# Patient Record
Sex: Female | Born: 1950 | Race: White | Hispanic: No | Marital: Married | Smoking: Never smoker
Health system: Southern US, Community
[De-identification: ages and names within clinical notes are randomized; demographics above are authoritative.]

## PROBLEM LIST (undated history)

## (undated) ENCOUNTER — Ambulatory Visit: Admission: EM | Discharge status: Against Medical Advice | Payer: Medicare HMO

## (undated) DIAGNOSIS — H5440 Blindness, one eye, unspecified eye: Secondary | ICD-10-CM

## (undated) DIAGNOSIS — N189 Chronic kidney disease, unspecified: Secondary | ICD-10-CM

## (undated) DIAGNOSIS — I2510 Atherosclerotic heart disease of native coronary artery without angina pectoris: Secondary | ICD-10-CM

## (undated) DIAGNOSIS — I779 Disorder of arteries and arterioles, unspecified: Secondary | ICD-10-CM

## (undated) DIAGNOSIS — Z955 Presence of coronary angioplasty implant and graft: Secondary | ICD-10-CM

## (undated) DIAGNOSIS — I739 Peripheral vascular disease, unspecified: Secondary | ICD-10-CM

## (undated) DIAGNOSIS — F209 Schizophrenia, unspecified: Secondary | ICD-10-CM

## (undated) DIAGNOSIS — E039 Hypothyroidism, unspecified: Secondary | ICD-10-CM

## (undated) DIAGNOSIS — G709 Myoneural disorder, unspecified: Secondary | ICD-10-CM

## (undated) DIAGNOSIS — E0590 Thyrotoxicosis, unspecified without thyrotoxic crisis or storm: Secondary | ICD-10-CM

## (undated) DIAGNOSIS — E785 Hyperlipidemia, unspecified: Secondary | ICD-10-CM

## (undated) DIAGNOSIS — N1831 Chronic kidney disease, stage 3a: Secondary | ICD-10-CM

## (undated) DIAGNOSIS — I10 Essential (primary) hypertension: Secondary | ICD-10-CM

## (undated) DIAGNOSIS — R0602 Shortness of breath: Secondary | ICD-10-CM

## (undated) DIAGNOSIS — I5189 Other ill-defined heart diseases: Secondary | ICD-10-CM

## (undated) DIAGNOSIS — G4730 Sleep apnea, unspecified: Secondary | ICD-10-CM

## (undated) DIAGNOSIS — E119 Type 2 diabetes mellitus without complications: Secondary | ICD-10-CM

## (undated) HISTORY — PX: EYE SURGERY: SHX253

---

## 2005-03-25 ENCOUNTER — Ambulatory Visit: Payer: Self-pay | Admitting: Unknown Physician Specialty

## 2005-04-08 ENCOUNTER — Encounter: Payer: Self-pay | Admitting: Unknown Physician Specialty

## 2005-04-13 ENCOUNTER — Encounter: Payer: Self-pay | Admitting: Unknown Physician Specialty

## 2005-05-14 ENCOUNTER — Encounter: Payer: Self-pay | Admitting: Unknown Physician Specialty

## 2005-07-20 ENCOUNTER — Ambulatory Visit (HOSPITAL_COMMUNITY): Admission: RE | Admit: 2005-07-20 | Discharge: 2005-07-21 | Payer: Self-pay | Admitting: Neurosurgery

## 2005-08-21 ENCOUNTER — Ambulatory Visit: Payer: Self-pay | Admitting: Neurosurgery

## 2005-09-18 ENCOUNTER — Ambulatory Visit: Payer: Self-pay | Admitting: Neurosurgery

## 2005-10-12 ENCOUNTER — Ambulatory Visit: Payer: Self-pay | Admitting: Internal Medicine

## 2006-05-04 ENCOUNTER — Ambulatory Visit: Payer: Self-pay | Admitting: Internal Medicine

## 2006-08-26 ENCOUNTER — Encounter: Admission: RE | Admit: 2006-08-26 | Discharge: 2006-08-26 | Payer: Self-pay | Admitting: Neurosurgery

## 2006-09-06 ENCOUNTER — Ambulatory Visit: Payer: Self-pay | Admitting: Neurosurgery

## 2006-09-30 ENCOUNTER — Ambulatory Visit: Payer: Self-pay | Admitting: General Surgery

## 2006-11-18 ENCOUNTER — Ambulatory Visit: Payer: Self-pay | Admitting: Obstetrics and Gynecology

## 2007-04-08 ENCOUNTER — Ambulatory Visit: Payer: Self-pay | Admitting: Ophthalmology

## 2007-11-29 ENCOUNTER — Ambulatory Visit: Payer: Self-pay | Admitting: Obstetrics and Gynecology

## 2008-12-11 ENCOUNTER — Ambulatory Visit: Payer: Self-pay | Admitting: Obstetrics and Gynecology

## 2009-12-12 ENCOUNTER — Ambulatory Visit: Payer: Self-pay | Admitting: Obstetrics and Gynecology

## 2010-04-01 ENCOUNTER — Ambulatory Visit: Payer: Self-pay | Admitting: Gastroenterology

## 2010-04-23 ENCOUNTER — Ambulatory Visit: Payer: Self-pay | Admitting: Gastroenterology

## 2010-04-23 HISTORY — PX: COLONOSCOPY WITH ESOPHAGOGASTRODUODENOSCOPY (EGD): SHX5779

## 2010-04-23 HISTORY — PX: COLONOSCOPY: SHX174

## 2011-02-03 ENCOUNTER — Ambulatory Visit: Payer: Self-pay | Admitting: Obstetrics and Gynecology

## 2011-08-20 ENCOUNTER — Ambulatory Visit: Payer: Self-pay | Admitting: Surgery

## 2011-09-21 ENCOUNTER — Ambulatory Visit: Payer: Self-pay | Admitting: Surgery

## 2011-09-24 ENCOUNTER — Ambulatory Visit: Payer: Self-pay | Admitting: Surgery

## 2011-10-26 ENCOUNTER — Ambulatory Visit: Payer: Self-pay | Admitting: Internal Medicine

## 2011-11-14 ENCOUNTER — Ambulatory Visit: Payer: Self-pay | Admitting: Internal Medicine

## 2011-12-15 HISTORY — PX: SUPERFICIAL LYMPH NODE BIOPSY / EXCISION: SUR127

## 2012-02-24 ENCOUNTER — Ambulatory Visit: Payer: Self-pay | Admitting: Obstetrics and Gynecology

## 2013-03-21 ENCOUNTER — Ambulatory Visit: Payer: Self-pay | Admitting: Obstetrics and Gynecology

## 2013-04-25 ENCOUNTER — Emergency Department: Payer: Self-pay | Admitting: Emergency Medicine

## 2013-04-25 LAB — COMPREHENSIVE METABOLIC PANEL
Anion Gap: 10 (ref 7–16)
Bilirubin,Total: 1.2 mg/dL — ABNORMAL HIGH (ref 0.2–1.0)
Chloride: 100 mmol/L (ref 98–107)
EGFR (Non-African Amer.): >60
Glucose: 387 mg/dL — ABNORMAL HIGH (ref 65–99)
Potassium: 3.9 mmol/L (ref 3.5–5.1)
SGOT(AST): 24 U/L (ref 15–37)
SGPT (ALT): 29 U/L (ref 12–78)

## 2013-04-25 LAB — URINALYSIS, COMPLETE
Blood: NEGATIVE
Glucose,UR: 100 mg/dL (ref 0–75)
Hyaline Cast: 3
Nitrite: NEGATIVE
Ph: 6 (ref 4.5–8.0)
Protein: NEGATIVE
RBC,UR: 2 /HPF (ref 0–5)
Squamous Epithelial: 1
WBC UR: 1 /HPF (ref 0–5)

## 2013-04-25 LAB — CBC
HGB: 13.3 g/dL (ref 12.0–16.0)
Platelet: 329 10*3/uL (ref 150–440)
RBC: 4.49 10*6/uL (ref 3.80–5.20)
RDW: 13.3 % (ref 11.5–14.5)
WBC: 10.4 10*3/uL (ref 3.6–11.0)

## 2013-04-25 LAB — TROPONIN I: Troponin-I: <0.02 ng/mL

## 2013-04-25 LAB — MAGNESIUM: Magnesium: 1.7 mg/dL — ABNORMAL LOW

## 2013-04-25 LAB — CK TOTAL AND CKMB (NOT AT ARMC): CK-MB: 0.7 ng/mL (ref 0.5–3.6)

## 2013-10-14 DIAGNOSIS — Z955 Presence of coronary angioplasty implant and graft: Secondary | ICD-10-CM

## 2013-10-14 DIAGNOSIS — I2510 Atherosclerotic heart disease of native coronary artery without angina pectoris: Secondary | ICD-10-CM

## 2013-10-14 HISTORY — DX: Atherosclerotic heart disease of native coronary artery without angina pectoris: I25.10

## 2013-10-14 HISTORY — DX: Presence of coronary angioplasty implant and graft: Z95.5

## 2013-10-19 ENCOUNTER — Ambulatory Visit: Payer: Self-pay | Admitting: Internal Medicine

## 2013-10-19 HISTORY — PX: CORONARY ANGIOPLASTY WITH STENT PLACEMENT: SHX49

## 2013-10-19 LAB — CK TOTAL AND CKMB (NOT AT ARMC): CK-MB: 0.8 ng/mL (ref 0.5–3.6)

## 2013-10-20 LAB — BASIC METABOLIC PANEL
Anion Gap: 7 (ref 7–16)
BUN: 14 mg/dL (ref 7–18)
Creatinine: 0.91 mg/dL (ref 0.60–1.30)
EGFR (Non-African Amer.): >60
Osmolality: 276 (ref 275–301)
Sodium: 134 mmol/L — ABNORMAL LOW (ref 136–145)

## 2013-10-20 LAB — CK TOTAL AND CKMB (NOT AT ARMC): CK-MB: <0.5 ng/mL — ABNORMAL LOW (ref 0.5–3.6)

## 2013-10-20 LAB — TROPONIN I: Troponin-I: <0.02 ng/mL

## 2013-11-17 ENCOUNTER — Encounter: Payer: Self-pay | Admitting: Internal Medicine

## 2013-12-14 ENCOUNTER — Encounter: Payer: Self-pay | Admitting: Internal Medicine

## 2014-05-10 ENCOUNTER — Ambulatory Visit: Payer: Self-pay | Admitting: Family Medicine

## 2015-01-11 DIAGNOSIS — I38 Endocarditis, valve unspecified: Secondary | ICD-10-CM | POA: Insufficient documentation

## 2015-01-11 DIAGNOSIS — E782 Mixed hyperlipidemia: Secondary | ICD-10-CM | POA: Insufficient documentation

## 2015-01-11 DIAGNOSIS — I2510 Atherosclerotic heart disease of native coronary artery without angina pectoris: Secondary | ICD-10-CM | POA: Insufficient documentation

## 2015-01-11 DIAGNOSIS — N183 Chronic kidney disease, stage 3 unspecified: Secondary | ICD-10-CM | POA: Insufficient documentation

## 2015-01-11 HISTORY — DX: Endocarditis, valve unspecified: I38

## 2015-04-05 NOTE — Consult Note (Signed)
PATIENT NAME:  Latoya Herman, Latoya Herman MR#:  [id number] DATE OF BIRTH:  1951-10-17  DATE OF ADMISSION:  04/25/2013  REFERRING PHYSICIAN:  Graciella Freer.   CONSULTING PHYSICIAN:  Amiley Shishido S. Gretel Acre, MD  REASON FOR CONSULTATION:  Psychiatric evaluation and medication adjustment.   HISTORY OF PRESENT ILLNESS: The patient is a 64 year old married white female who presented to the ER due to increased heart rate, diaphoresis, and asking for behavior medical consult. The patient was mildly anxious and was disorganized. She was evaluated in the presence of her daughter, as well as her husband. The patient reported that she has been living with her husband, but has been feeling stressed out for the past 3 months. She reported that she worked at [employer], but started being more apprehensive and anxious for the past week. The patient reported that she was sitting in front of her computer in the office but was unable to finish her work and feels like that either she was talking to the computer or the computer was talking to her. She feels that her mind was unable to concentrate. Then she felt that 2 of her coworkers were talking about her. She also felt that one of the guys who came into the office was humming. She felt that the voices were getting stronger. She also felt that she was being taped, and the dog was  wagging the tail against the wall. The patient reported that the coworkers complained about her to human resources, but nobody called her. However, her supervisor called her yesterday and asking her about her state of health, as well as if she needs any changes in her work environment. The patient reported that she was becoming more apprehensive, but she cannot pinpoint the reason for her anxiety and increased paranoia. Her daughter reported that the patient has long history of mental illness, and she was diagnosed with schizophrenia in the past. She was taking medications, but she was unable to clearly identify the  name of the medication. She stated that the side effects included increased weight gain and increased risk of diabetes. When I told them about diabetes, they recognized it immediately. The patient reported that she took the medication for a long time, but then she stopped taking the medication. The patient reported that she is not taking any psychotropic medication at this time. The daughter also verified that the patient has been noncompliant with her medical medications, including for diabetes and for hypertension. Her husband also reported that the patient feels that everybody is against her at home. She feels paranoid at home as well. She does not sleep well and has been becoming increasingly delusional. The patient does not have any suicidal or homicidal ideations or plans.   PAST PSYCHIATRIC HISTORY: The patient has been diagnosed with schizophrenia in the past and has been prescribed psychotropic medications including Zyprexa. She does not have any history of psychiatric hospitalization, as well as suicide attempt.   FAMILY HISTORY: There is no history of psychiatric hospitalization or suicide attempts in the family.   ALCOHOL AND DRUG USE HISTORY: None.   MEDICAL HISTORY: 1.  Hypertension.  2.  Diabetes.  3.  Hypercholesterolemia.   CURRENT MEDICATIONS:  1.  Metformin 500 mg b.i.d.  2.  Metoprolol 50 mg daily.  3.  Aspirin enteric-coated 81 mg daily.  4.  Nexium 1 tablet daily.   ALLERGIES: No known drug allergies. PENICILLIN CAUSES SWELLING.   SOCIAL HISTORY: The patient is currently married and lives with her husband.  She has a daughter who was supportive. She currently works at [employer]. No pending legal charges.   REVIEW OF SYSTEMS: CONSTITUTIONAL: Denies having any fever, fatigue, weakness or pain.  EYES: Denies having any blurred or double vision.  EARS, NOSE, THROAT: No tinnitus. No ear pain or hearing loss noted. RESPIRATORY: Denies any cough, wheezing.  CARDIOVASCULAR:  No orthopnea or chest pain noted.  GASTROINTESTINAL: No nausea, vomiting, diarrhea noted.  GENITOURINARY: No dysuria or hematuria.  ENDOCRINE: No polyuria, polydipsia noted.  INTEGUMENTARY: No acne or rash noted.  NEUROLOGICAL: No numbness, weakness noted.   PHYSICAL EXAMINATION:  VITAL SIGNS:  Temperature 98.8, pulse 102, respirations 18, blood pressure 192/101.    LABORATORY DATA: Glucose 387, BUN 14, creatinine 0.88, sodium 133, potassium 3.9, chloride 100, BUN 23, anion gap 10, osmolality 283, calcium 9.3. Magnesium 1.7. Protein 7.9, albumin 3.6. CK 134, troponin is less than 0.2. TSH 8.91. WBC 10.4, RBC 4.49, hemoglobin 13.3, hematocrit 39.3, platelet count 329.   MENTAL STATUS EXAMINATION: The patient is a moderately-built female who was lying in the bed. She appeared apprehensive. Her speech was low in tone and volume. Mood was depressed and anxious. Affect was congruent. Thought process circumstantial. Thought content was non-delusional. She was unable to describe in detail the events which led to her presentation to the ED. She appears somewhat delusional at this time.   DIAGNOSTIC IMPRESSION: AXIS I:  1.  Schizophrenia, chronic, disorganized type.  2.  Anxiety disorder, not otherwise specified.  AXIS II: None.  AXIS III: Diabetes, hypertension, hypercholesterolemia.   TREATMENT PLAN: 1.  The patient will be released from the ED at this time, and she will be started on Abilify at 5 mg p.o. at bedtime.  2.  Cogentin 1 mg p.o. at bedtime.  3.  Klonopin 0.5 mg p.o. at bedtime.  4.  She will be following up in the outpatient behavioral health [place] [facility] on Monday at 10:30 a.m. Discussed with her family at length about the discharge plan, and they demonstrated understanding.   Thank you for allowing me to participate in the care of this patient.   ____________________________ Cordelia Pen. Gretel Acre, MD usf:dmm D: 04/25/2013 11:35:27 ET T: 04/25/2013  12:00:36 ET JOB#: [id number]  cc: Cordelia Pen. Gretel Acre, MD, <Dictator> Jeronimo Norma MD ELECTRONICALLY SIGNED 04/27/2013 13:38

## 2015-04-05 NOTE — Discharge Summary (Signed)
PATIENT NAME:  Latoya Herman, Latoya Herman MR#:  [id number] DATE OF BIRTH:  [birth year]  DATE OF ADMISSION:  10/19/2013 DATE OF DISCHARGE: 10/20/2013   DISCHARGE DIAGNOSES: 1.  Unstable angina. 2.  Coronary artery disease.  3.  Hypertension.  4.  Hyperlipidemia.  5.  Diabetes.  HISTORY: This is a 64 year old female with diabetes, hypertension, hyperlipidemia with progressive episodes of angina and abnormal stress test in the anterior wall. The patient had a cardiac catheterization showing diffuse coronary artery disease with a significant 90% stenosis of the left anterior descending artery in the mid section. She underwent PCI and stent placement of a drug-eluting stent to the left anterior descending artery without complication. The patient was ambulating well without any further significant symptoms had reached her maximal hospital benefit. She is to be discharged home with followup in 2 weeks.   DISCHARGE MEDICATIONS: Including metoprolol 50 mg each day, lisinopril 10 mg p.o. daily, Lipitor 20 mg p.o. daily, 10 mg p.o. daily, olanzapine 5 mg p.o. daily, glipizide 5 mg p.o. daily, levothyroxine 25 mcg p.o. daily, aspirin 325 mg each day, Plavix 75 mg each day and she is to hold off on metformin for 3 days until reinstatement at a later date for diabetes.   ____________________________ Corey Skains, MD bjk:aw D: 10/20/2013 07:26:54 ET T: 10/20/2013 07:43:19 ET JOB#: [id number]  cc: Corey Skains, MD, <Dictator> Corey Skains MD ELECTRONICALLY SIGNED 10/30/2013 13:16

## 2015-06-26 DIAGNOSIS — I10 Essential (primary) hypertension: Secondary | ICD-10-CM | POA: Insufficient documentation

## 2016-01-28 DIAGNOSIS — I10 Essential (primary) hypertension: Secondary | ICD-10-CM | POA: Diagnosis not present

## 2016-01-28 DIAGNOSIS — M79605 Pain in left leg: Secondary | ICD-10-CM | POA: Diagnosis not present

## 2016-01-28 DIAGNOSIS — E782 Mixed hyperlipidemia: Secondary | ICD-10-CM | POA: Diagnosis not present

## 2016-01-28 DIAGNOSIS — I2510 Atherosclerotic heart disease of native coronary artery without angina pectoris: Secondary | ICD-10-CM | POA: Diagnosis not present

## 2016-09-25 DIAGNOSIS — Z23 Encounter for immunization: Secondary | ICD-10-CM | POA: Diagnosis not present

## 2016-12-23 DIAGNOSIS — E119 Type 2 diabetes mellitus without complications: Secondary | ICD-10-CM | POA: Diagnosis not present

## 2017-05-17 DIAGNOSIS — E782 Mixed hyperlipidemia: Secondary | ICD-10-CM | POA: Diagnosis not present

## 2017-05-17 DIAGNOSIS — I2510 Atherosclerotic heart disease of native coronary artery without angina pectoris: Secondary | ICD-10-CM | POA: Diagnosis not present

## 2017-05-17 DIAGNOSIS — I10 Essential (primary) hypertension: Secondary | ICD-10-CM | POA: Diagnosis not present

## 2017-06-24 DIAGNOSIS — L03312 Cellulitis of back [any part except buttock]: Secondary | ICD-10-CM | POA: Diagnosis not present

## 2017-09-08 DIAGNOSIS — Z0000 Encounter for general adult medical examination without abnormal findings: Secondary | ICD-10-CM | POA: Insufficient documentation

## 2017-09-08 DIAGNOSIS — E119 Type 2 diabetes mellitus without complications: Secondary | ICD-10-CM | POA: Diagnosis not present

## 2017-09-08 DIAGNOSIS — E782 Mixed hyperlipidemia: Secondary | ICD-10-CM | POA: Diagnosis not present

## 2017-09-08 DIAGNOSIS — I10 Essential (primary) hypertension: Secondary | ICD-10-CM | POA: Diagnosis not present

## 2017-09-08 DIAGNOSIS — I2510 Atherosclerotic heart disease of native coronary artery without angina pectoris: Secondary | ICD-10-CM | POA: Diagnosis not present

## 2017-09-08 DIAGNOSIS — Z1231 Encounter for screening mammogram for malignant neoplasm of breast: Secondary | ICD-10-CM | POA: Diagnosis not present

## 2017-09-08 DIAGNOSIS — H9313 Tinnitus, bilateral: Secondary | ICD-10-CM | POA: Diagnosis not present

## 2017-09-08 DIAGNOSIS — Z23 Encounter for immunization: Secondary | ICD-10-CM | POA: Diagnosis not present

## 2017-09-10 DIAGNOSIS — E782 Mixed hyperlipidemia: Secondary | ICD-10-CM | POA: Diagnosis not present

## 2017-09-10 DIAGNOSIS — E119 Type 2 diabetes mellitus without complications: Secondary | ICD-10-CM | POA: Diagnosis not present

## 2017-09-10 DIAGNOSIS — H9313 Tinnitus, bilateral: Secondary | ICD-10-CM | POA: Diagnosis not present

## 2017-09-10 DIAGNOSIS — I2510 Atherosclerotic heart disease of native coronary artery without angina pectoris: Secondary | ICD-10-CM | POA: Diagnosis not present

## 2017-09-13 ENCOUNTER — Other Ambulatory Visit: Payer: Self-pay | Admitting: Internal Medicine

## 2017-09-13 DIAGNOSIS — Z1231 Encounter for screening mammogram for malignant neoplasm of breast: Secondary | ICD-10-CM

## 2017-09-17 DIAGNOSIS — H903 Sensorineural hearing loss, bilateral: Secondary | ICD-10-CM | POA: Diagnosis not present

## 2017-09-17 DIAGNOSIS — H9319 Tinnitus, unspecified ear: Secondary | ICD-10-CM | POA: Diagnosis not present

## 2017-09-28 DIAGNOSIS — I10 Essential (primary) hypertension: Secondary | ICD-10-CM | POA: Diagnosis not present

## 2017-09-28 DIAGNOSIS — M5136 Other intervertebral disc degeneration, lumbar region: Secondary | ICD-10-CM | POA: Diagnosis not present

## 2017-09-28 DIAGNOSIS — E119 Type 2 diabetes mellitus without complications: Secondary | ICD-10-CM | POA: Diagnosis not present

## 2017-09-28 DIAGNOSIS — M25552 Pain in left hip: Secondary | ICD-10-CM | POA: Diagnosis not present

## 2017-09-28 DIAGNOSIS — M5440 Lumbago with sciatica, unspecified side: Secondary | ICD-10-CM | POA: Diagnosis not present

## 2017-10-01 ENCOUNTER — Ambulatory Visit
Admission: RE | Admit: 2017-10-01 | Discharge: 2017-10-01 | Disposition: A | Discharge status: Home / Self Care | Payer: PPO | Source: Ambulatory Visit | Attending: Internal Medicine | Admitting: Internal Medicine

## 2017-10-01 ENCOUNTER — Encounter (HOSPITAL_COMMUNITY): Payer: Self-pay

## 2017-10-01 DIAGNOSIS — Z1231 Encounter for screening mammogram for malignant neoplasm of breast: Secondary | ICD-10-CM | POA: Diagnosis not present

## 2017-10-05 DIAGNOSIS — M5440 Lumbago with sciatica, unspecified side: Secondary | ICD-10-CM | POA: Diagnosis not present

## 2017-10-05 DIAGNOSIS — M6281 Muscle weakness (generalized): Secondary | ICD-10-CM | POA: Diagnosis not present

## 2017-10-12 DIAGNOSIS — M5440 Lumbago with sciatica, unspecified side: Secondary | ICD-10-CM | POA: Diagnosis not present

## 2017-10-12 DIAGNOSIS — M6281 Muscle weakness (generalized): Secondary | ICD-10-CM | POA: Diagnosis not present

## 2017-10-12 DIAGNOSIS — E119 Type 2 diabetes mellitus without complications: Secondary | ICD-10-CM | POA: Diagnosis not present

## 2017-10-12 DIAGNOSIS — M5136 Other intervertebral disc degeneration, lumbar region: Secondary | ICD-10-CM | POA: Diagnosis not present

## 2017-10-12 DIAGNOSIS — M222X2 Patellofemoral disorders, left knee: Secondary | ICD-10-CM | POA: Diagnosis not present

## 2017-10-19 DIAGNOSIS — M5440 Lumbago with sciatica, unspecified side: Secondary | ICD-10-CM | POA: Diagnosis not present

## 2017-10-19 DIAGNOSIS — M6281 Muscle weakness (generalized): Secondary | ICD-10-CM | POA: Diagnosis not present

## 2017-10-21 DIAGNOSIS — M6281 Muscle weakness (generalized): Secondary | ICD-10-CM | POA: Diagnosis not present

## 2017-10-21 DIAGNOSIS — M5440 Lumbago with sciatica, unspecified side: Secondary | ICD-10-CM | POA: Diagnosis not present

## 2017-10-26 DIAGNOSIS — M5440 Lumbago with sciatica, unspecified side: Secondary | ICD-10-CM | POA: Diagnosis not present

## 2017-10-26 DIAGNOSIS — M6281 Muscle weakness (generalized): Secondary | ICD-10-CM | POA: Diagnosis not present

## 2017-11-09 DIAGNOSIS — M6281 Muscle weakness (generalized): Secondary | ICD-10-CM | POA: Diagnosis not present

## 2017-11-09 DIAGNOSIS — M5440 Lumbago with sciatica, unspecified side: Secondary | ICD-10-CM | POA: Diagnosis not present

## 2017-11-18 DIAGNOSIS — M5440 Lumbago with sciatica, unspecified side: Secondary | ICD-10-CM | POA: Diagnosis not present

## 2017-11-25 DIAGNOSIS — M6281 Muscle weakness (generalized): Secondary | ICD-10-CM | POA: Diagnosis not present

## 2017-11-25 DIAGNOSIS — M5440 Lumbago with sciatica, unspecified side: Secondary | ICD-10-CM | POA: Diagnosis not present

## 2018-01-17 ENCOUNTER — Other Ambulatory Visit: Payer: Self-pay | Admitting: Internal Medicine

## 2018-01-17 DIAGNOSIS — E039 Hypothyroidism, unspecified: Secondary | ICD-10-CM | POA: Insufficient documentation

## 2018-01-17 DIAGNOSIS — N644 Mastodynia: Secondary | ICD-10-CM

## 2018-01-25 ENCOUNTER — Other Ambulatory Visit: Payer: PPO

## 2018-01-26 ENCOUNTER — Ambulatory Visit
Admission: RE | Admit: 2018-01-26 | Discharge: 2018-01-26 | Disposition: A | Discharge status: Home / Self Care | Payer: Medicare HMO | Source: Ambulatory Visit | Attending: Internal Medicine | Admitting: Internal Medicine

## 2018-01-26 DIAGNOSIS — N644 Mastodynia: Secondary | ICD-10-CM | POA: Diagnosis present

## 2018-12-15 ENCOUNTER — Other Ambulatory Visit: Payer: Self-pay | Admitting: Internal Medicine

## 2018-12-15 ENCOUNTER — Other Ambulatory Visit (HOSPITAL_COMMUNITY): Payer: Self-pay | Admitting: Internal Medicine

## 2018-12-15 DIAGNOSIS — I2510 Atherosclerotic heart disease of native coronary artery without angina pectoris: Secondary | ICD-10-CM

## 2018-12-15 DIAGNOSIS — N179 Acute kidney failure, unspecified: Secondary | ICD-10-CM

## 2018-12-22 ENCOUNTER — Ambulatory Visit
Admission: RE | Admit: 2018-12-22 | Discharge: 2018-12-22 | Disposition: A | Discharge status: Home / Self Care | Payer: Medicare HMO | Source: Ambulatory Visit | Attending: Internal Medicine | Admitting: Internal Medicine

## 2018-12-22 DIAGNOSIS — I2510 Atherosclerotic heart disease of native coronary artery without angina pectoris: Secondary | ICD-10-CM

## 2018-12-22 DIAGNOSIS — N179 Acute kidney failure, unspecified: Secondary | ICD-10-CM

## 2019-01-20 ENCOUNTER — Other Ambulatory Visit: Payer: Self-pay | Admitting: Internal Medicine

## 2019-01-20 DIAGNOSIS — Z1231 Encounter for screening mammogram for malignant neoplasm of breast: Secondary | ICD-10-CM

## 2019-02-09 ENCOUNTER — Ambulatory Visit
Admission: RE | Admit: 2019-02-09 | Discharge: 2019-02-09 | Disposition: A | Discharge status: Home / Self Care | Payer: Medicare HMO | Source: Ambulatory Visit | Attending: Internal Medicine | Admitting: Internal Medicine

## 2019-02-09 DIAGNOSIS — Z1231 Encounter for screening mammogram for malignant neoplasm of breast: Secondary | ICD-10-CM

## 2019-02-28 ENCOUNTER — Other Ambulatory Visit: Payer: Self-pay | Admitting: Physical Medicine and Rehabilitation

## 2019-02-28 DIAGNOSIS — M5416 Radiculopathy, lumbar region: Secondary | ICD-10-CM

## 2019-03-08 ENCOUNTER — Ambulatory Visit
Admission: RE | Admit: 2019-03-08 | Discharge: 2019-03-08 | Disposition: A | Discharge status: Home / Self Care | Payer: Medicare HMO | Source: Ambulatory Visit | Attending: Physical Medicine and Rehabilitation | Admitting: Physical Medicine and Rehabilitation

## 2019-03-08 ENCOUNTER — Other Ambulatory Visit: Payer: Self-pay

## 2019-03-08 DIAGNOSIS — M5416 Radiculopathy, lumbar region: Secondary | ICD-10-CM | POA: Diagnosis not present

## 2019-10-02 DIAGNOSIS — R0602 Shortness of breath: Secondary | ICD-10-CM | POA: Insufficient documentation

## 2020-06-27 ENCOUNTER — Other Ambulatory Visit: Payer: Self-pay | Admitting: Internal Medicine

## 2020-06-27 DIAGNOSIS — Z1231 Encounter for screening mammogram for malignant neoplasm of breast: Secondary | ICD-10-CM

## 2020-07-18 ENCOUNTER — Other Ambulatory Visit: Payer: Self-pay

## 2020-07-18 ENCOUNTER — Ambulatory Visit
Admission: RE | Admit: 2020-07-18 | Discharge: 2020-07-18 | Disposition: A | Discharge status: Home / Self Care | Payer: Medicare HMO | Source: Ambulatory Visit | Attending: Internal Medicine | Admitting: Internal Medicine

## 2020-07-18 DIAGNOSIS — Z1231 Encounter for screening mammogram for malignant neoplasm of breast: Secondary | ICD-10-CM | POA: Insufficient documentation

## 2020-08-21 ENCOUNTER — Other Ambulatory Visit
Admission: RE | Admit: 2020-08-21 | Discharge: 2020-08-21 | Disposition: A | Discharge status: Home / Self Care | Payer: Medicare HMO | Source: Ambulatory Visit | Attending: Gastroenterology | Admitting: Gastroenterology

## 2020-08-21 ENCOUNTER — Other Ambulatory Visit: Payer: Self-pay

## 2020-08-21 DIAGNOSIS — Z01812 Encounter for preprocedural laboratory examination: Secondary | ICD-10-CM | POA: Diagnosis present

## 2020-08-21 DIAGNOSIS — Z20822 Contact with and (suspected) exposure to covid-19: Secondary | ICD-10-CM | POA: Diagnosis not present

## 2020-08-21 LAB — SARS CORONAVIRUS 2 (TAT 6-24 HRS): SARS Coronavirus 2: NEGATIVE

## 2020-08-22 ENCOUNTER — Encounter: Payer: Self-pay | Admitting: *Deleted

## 2020-08-23 ENCOUNTER — Ambulatory Visit: Payer: Medicare HMO | Admitting: Anesthesiology

## 2020-08-23 ENCOUNTER — Ambulatory Visit
Admission: RE | Admit: 2020-08-23 | Discharge: 2020-08-23 | Disposition: A | Discharge status: Home / Self Care | Payer: Medicare HMO | Attending: Gastroenterology | Admitting: Gastroenterology

## 2020-08-23 ENCOUNTER — Other Ambulatory Visit: Payer: Self-pay

## 2020-08-23 ENCOUNTER — Encounter: Payer: Self-pay | Admitting: *Deleted

## 2020-08-23 ENCOUNTER — Encounter
Admission: RE | Disposition: A | Discharge status: Home / Self Care | Payer: Self-pay | Source: Home / Self Care | Attending: Gastroenterology

## 2020-08-23 DIAGNOSIS — N189 Chronic kidney disease, unspecified: Secondary | ICD-10-CM | POA: Diagnosis not present

## 2020-08-23 DIAGNOSIS — Z888 Allergy status to other drugs, medicaments and biological substances status: Secondary | ICD-10-CM | POA: Insufficient documentation

## 2020-08-23 DIAGNOSIS — Z79899 Other long term (current) drug therapy: Secondary | ICD-10-CM | POA: Insufficient documentation

## 2020-08-23 DIAGNOSIS — I129 Hypertensive chronic kidney disease with stage 1 through stage 4 chronic kidney disease, or unspecified chronic kidney disease: Secondary | ICD-10-CM | POA: Diagnosis not present

## 2020-08-23 DIAGNOSIS — Z955 Presence of coronary angioplasty implant and graft: Secondary | ICD-10-CM | POA: Insufficient documentation

## 2020-08-23 DIAGNOSIS — Z79890 Hormone replacement therapy (postmenopausal): Secondary | ICD-10-CM | POA: Diagnosis not present

## 2020-08-23 DIAGNOSIS — Z1211 Encounter for screening for malignant neoplasm of colon: Secondary | ICD-10-CM | POA: Diagnosis not present

## 2020-08-23 DIAGNOSIS — I2510 Atherosclerotic heart disease of native coronary artery without angina pectoris: Secondary | ICD-10-CM | POA: Insufficient documentation

## 2020-08-23 DIAGNOSIS — G4730 Sleep apnea, unspecified: Secondary | ICD-10-CM | POA: Insufficient documentation

## 2020-08-23 DIAGNOSIS — Z8719 Personal history of other diseases of the digestive system: Secondary | ICD-10-CM | POA: Diagnosis present

## 2020-08-23 DIAGNOSIS — K621 Rectal polyp: Secondary | ICD-10-CM | POA: Diagnosis not present

## 2020-08-23 DIAGNOSIS — D125 Benign neoplasm of sigmoid colon: Secondary | ICD-10-CM | POA: Insufficient documentation

## 2020-08-23 DIAGNOSIS — K640 First degree hemorrhoids: Secondary | ICD-10-CM | POA: Diagnosis not present

## 2020-08-23 DIAGNOSIS — E1122 Type 2 diabetes mellitus with diabetic chronic kidney disease: Secondary | ICD-10-CM | POA: Diagnosis not present

## 2020-08-23 DIAGNOSIS — D123 Benign neoplasm of transverse colon: Secondary | ICD-10-CM | POA: Insufficient documentation

## 2020-08-23 DIAGNOSIS — Z880 Allergy status to penicillin: Secondary | ICD-10-CM | POA: Diagnosis not present

## 2020-08-23 DIAGNOSIS — E785 Hyperlipidemia, unspecified: Secondary | ICD-10-CM | POA: Diagnosis not present

## 2020-08-23 DIAGNOSIS — D122 Benign neoplasm of ascending colon: Secondary | ICD-10-CM | POA: Insufficient documentation

## 2020-08-23 DIAGNOSIS — Z7984 Long term (current) use of oral hypoglycemic drugs: Secondary | ICD-10-CM | POA: Diagnosis not present

## 2020-08-23 DIAGNOSIS — Z9049 Acquired absence of other specified parts of digestive tract: Secondary | ICD-10-CM | POA: Diagnosis not present

## 2020-08-23 HISTORY — DX: Atherosclerotic heart disease of native coronary artery without angina pectoris: I25.10

## 2020-08-23 HISTORY — DX: Chronic kidney disease, unspecified: N18.9

## 2020-08-23 HISTORY — DX: Type 2 diabetes mellitus without complications: E11.9

## 2020-08-23 HISTORY — DX: Hyperlipidemia, unspecified: E78.5

## 2020-08-23 HISTORY — PX: COLONOSCOPY WITH PROPOFOL: SHX5780

## 2020-08-23 HISTORY — DX: Essential (primary) hypertension: I10

## 2020-08-23 HISTORY — DX: Shortness of breath: R06.02

## 2020-08-23 LAB — GLUCOSE, CAPILLARY
Glucose-Capillary: 122 mg/dL — ABNORMAL HIGH (ref 70–99)
Glucose-Capillary: 115 mg/dL — ABNORMAL HIGH (ref 70–99)

## 2020-08-23 SURGERY — COLONOSCOPY WITH PROPOFOL
Anesthesia: General

## 2020-08-23 MED ORDER — PROPOFOL 500 MG/50ML IV EMUL
INTRAVENOUS | Status: AC
  Filled 2020-08-23: qty 50

## 2020-08-23 MED ORDER — PROPOFOL 500 MG/50ML IV EMUL
INTRAVENOUS | Status: DC | PRN
  Administered 2020-08-23: 130 ug/kg/min via INTRAVENOUS

## 2020-08-23 MED ORDER — PROPOFOL 10 MG/ML IV BOLUS
INTRAVENOUS | Status: DC | PRN
  Administered 2020-08-23: 80 mg via INTRAVENOUS

## 2020-08-23 MED ORDER — LIDOCAINE HCL (PF) 2 % IJ SOLN
INTRAMUSCULAR | Status: AC
  Filled 2020-08-23: qty 5

## 2020-08-23 MED ORDER — LIDOCAINE HCL (CARDIAC) PF 100 MG/5ML IV SOSY
PREFILLED_SYRINGE | INTRAVENOUS | Status: DC | PRN
  Administered 2020-08-23: 50 mg via INTRAVENOUS

## 2020-08-23 MED ORDER — SODIUM CHLORIDE 0.9 % IV SOLN: INTRAVENOUS | Status: DC

## 2020-08-23 NOTE — Anesthesia Postprocedure Evaluation (Signed)
Anesthesia Post Note  Patient: Latoya Herman  Procedure(s) Performed: COLONOSCOPY WITH PROPOFOL (N/A )  Patient location during evaluation: Endoscopy Anesthesia Type: General Level of consciousness: awake and alert Pain management: pain level controlled Vital Signs Assessment: post-procedure vital signs reviewed and stable Respiratory status: spontaneous breathing, nonlabored ventilation, respiratory function stable and patient connected to nasal cannula oxygen Cardiovascular status: blood pressure returned to baseline and stable Postop Assessment: no apparent nausea or vomiting Anesthetic complications: no   No complications documented.   Last Vitals:  Vitals:   08/23/20 0914 08/23/20 0934  BP: 119/68 (!) 146/61  Pulse: 70   Resp: (!) 9   Temp: 36.6 C   SpO2: 99%     Last Pain:  Vitals:   08/23/20 0934  TempSrc:   PainSc: 0-No pain                 Latoya Herman

## 2020-08-23 NOTE — Op Note (Signed)
[facility] Gastroenterology Patient Name: Latoya Herman Procedure Date: 08/23/2020 8:36 AM MRN: [id number] Account #: [id number] Date of Birth: 1951-02-09 Admit Type: Outpatient Age: 69 Room: [facility] ENDO ROOM 3 Gender: Female Note Status: Finalized Procedure:             Colonoscopy Indications:           Screening for colorectal malignant neoplasm Providers:             Andrey Farmer MD, MD Referring MD:          Ocie Cornfield. Ouida Sills MD, MD (Referring MD) Medicines:             Monitored Anesthesia Care Complications:         No immediate complications. Estimated blood loss:                         Minimal. Procedure:             Pre-Anesthesia Assessment:                        - Prior to the procedure, a History and Physical was                         performed, and patient medications and allergies were                         reviewed. The patient is competent. The risks and                         benefits of the procedure and the sedation options and                         risks were discussed with the patient. All questions                         were answered and informed consent was obtained.                         Patient identification and proposed procedure were                         verified by the physician, the nurse, the anesthetist                         and the technician in the endoscopy suite. Mental                         Status Examination: alert and oriented. Airway                         Examination: normal oropharyngeal airway and neck                         mobility. Respiratory Examination: clear to                         auscultation. CV Examination: normal. Prophylactic  Antibiotics: The patient does not require prophylactic                         antibiotics. Prior Anticoagulants: The patient has                         taken no previous anticoagulant or antiplatelet                         agents.  ASA Grade Assessment: III - A patient with                         severe systemic disease. After reviewing the risks and                         benefits, the patient was deemed in satisfactory                         condition to undergo the procedure. The anesthesia                         plan was to use monitored anesthesia care (MAC).                         Immediately prior to administration of medications,                         the patient was re-assessed for adequacy to receive                         sedatives. The heart rate, respiratory rate, oxygen                         saturations, blood pressure, adequacy of pulmonary                         ventilation, and response to care were monitored                         throughout the procedure. The physical status of the                         patient was re-assessed after the procedure.                        After obtaining informed consent, the colonoscope was                         passed under direct vision. Throughout the procedure,                         the patient's blood pressure, pulse, and oxygen                         saturations were monitored continuously. The                         Colonoscope was introduced through the anus and  advanced to the the cecum, identified by appendiceal                         orifice and ileocecal valve. The colonoscopy was                         performed without difficulty. The patient tolerated                         the procedure well. The quality of the bowel                         preparation was good. Findings:      The perianal and digital rectal examinations were normal.      A 4 mm polyp was found in the ascending colon. The polyp was sessile.       The polyp was removed with a cold snare. Resection and retrieval were       complete. Estimated blood loss was minimal.      A 2 mm polyp was found in the transverse colon. The polyp was  sessile.       The polyp was removed with a cold snare. Resection and retrieval were       complete. Estimated blood loss was minimal.      A 6 mm polyp was found in the recto-sigmoid colon. The polyp was       semi-pedunculated. The polyp was removed with a cold snare. Resection       and retrieval were complete. Estimated blood loss was minimal. A small       amount of possible residual tissue seen at the periphery of polypectomy       but likely from edema from cold snare but this was biopsied and placed       in seperate jar.      A 2 mm polyp was found in the rectum. The polyp was sessile. The polyp       was removed with a jumbo cold forceps. Resection and retrieval were       complete. Estimated blood loss was minimal.      Internal hemorrhoids were found during retroflexion. The hemorrhoids       were Grade I (internal hemorrhoids that do not prolapse). Impression:            - One 4 mm polyp in the ascending colon, removed with                         a cold snare. Resected and retrieved.                        - One 2 mm polyp in the transverse colon, removed with                         a cold snare. Resected and retrieved.                        - One 6 mm polyp at the recto-sigmoid colon, removed                         with a cold snare. Resected and retrieved.                        -  One 2 mm polyp in the rectum, removed with a jumbo                         cold forceps. Resected and retrieved. Recommendation:        - Repeat colonoscopy for surveillance based on                         pathology results.                        - Return to referring physician as previously                         scheduled.                        - Resume previous diet.                        - Resume Plavix (clopidogrel) at prior dose tomorrow.                        - Await pathology results. Procedure Code(s):     --- Professional ---                        [phone], Colonoscopy,  flexible; with removal of                         tumor(s), polyp(s), or other lesion(s) by snare                         technique                        45380, 63, Colonoscopy, flexible; with biopsy, single                         or multiple Diagnosis Code(s):     --- Professional ---                        Z12.11, Encounter for screening for malignant neoplasm                         of colon                        K63.5, Polyp of colon                        K62.1, Rectal polyp CPT copyright 2019 American Medical Association. All rights reserved. The codes documented in this report are preliminary and upon coder review may  be revised to meet current compliance requirements. Andrey Farmer, MD Andrey Farmer MD, MD 08/23/2020 9:16:54 AM Number of Addenda: 0 Note Initiated On: 08/23/2020 8:36 AM Scope Withdrawal Time: 0 hours 22 minutes 11 seconds  Total Procedure Duration: 0 hours 27 minutes 26 seconds  Estimated Blood Loss:  Estimated blood loss was minimal.      [facility]

## 2020-08-23 NOTE — Anesthesia Preprocedure Evaluation (Signed)
Anesthesia Evaluation  Patient identified by MRN, date of birth, ID band Patient awake    Reviewed: Allergy & Precautions, H&P , NPO status , Patient's Chart, lab work & pertinent test results  History of Anesthesia Complications Negative for: history of anesthetic complications  Airway Mallampati: III  TM Distance: <3 FB Neck ROM: limited    Dental  (+) Chipped   Pulmonary sleep apnea ,    Pulmonary exam normal        Cardiovascular Exercise Tolerance: Good hypertension, (-) angina+ CAD and + Cardiac Stents  (-) DOE Normal cardiovascular exam     Neuro/Psych negative neurological ROS  negative psych ROS   GI/Hepatic negative GI ROS, Neg liver ROS,   Endo/Other  diabetes, Type 2  Renal/GU Renal disease  negative genitourinary   Musculoskeletal   Abdominal   Peds  Hematology negative hematology ROS (+)   Anesthesia Other Findings Past Medical History: No date: Chronic kidney disease No date: Coronary artery disease No date: Diabetes mellitus without complication (HCC) No date: Hyperlipidemia No date: Hypertension No date: SOBOE (shortness of breath on exertion)  Past Surgical History: 04/23/2010: COLONOSCOPY 04/23/2010: COLONOSCOPY WITH ESOPHAGOGASTRODUODENOSCOPY (EGD) 10/19/2013: CORONARY ANGIOPLASTY WITH STENT PLACEMENT 2013: SUPERFICIAL LYMPH NODE BIOPSY / EXCISION  BMI    Body Mass Index: 23.63 kg/m      Reproductive/Obstetrics negative OB ROS                             Anesthesia Physical Anesthesia Plan  ASA: III  Anesthesia Plan: General   Post-op Pain Management:    Induction: Intravenous  PONV Risk Score and Plan: Propofol infusion and TIVA  Airway Management Planned: Natural Airway and Nasal Cannula  Additional Equipment:   Intra-op Plan:   Post-operative Plan:   Informed Consent: I have reviewed the patients History and Physical, chart, labs and  discussed the procedure including the risks, benefits and alternatives for the proposed anesthesia with the patient or authorized representative who has indicated his/her understanding and acceptance.     Dental Advisory Given  Plan Discussed with: Anesthesiologist, CRNA and Surgeon  Anesthesia Plan Comments: (Patient consented for risks of anesthesia including but not limited to:  - adverse reactions to medications - risk of intubation if required - damage to eyes, teeth, lips or other oral mucosa - nerve damage due to positioning  - sore throat or hoarseness - Damage to heart, brain, nerves, lungs, other parts of body or loss of life  Patient voiced understanding.)        Anesthesia Quick Evaluation

## 2020-08-23 NOTE — H&P (Signed)
Outpatient short stay form Pre-procedure 08/23/2020 8:34 AM Latoya Miyamoto MD, MPH  Primary Physician: Dr. Ouida Sills  Reason for visit:  Screening Colon  History of present illness:   69 y/o lady with history of hyperplastic polyps here for colonoscopy. Last took plavix > 5 days ago. History of cholecystectomy. No family history of any GI malignancies.    Current Facility-Administered Medications:  .  0.9 %  sodium chloride infusion, , Intravenous, Continuous, Vidur Knust, Hilton Cork, MD, Last Rate: 20 mL/hr at 08/23/20 0814, New Bag at 08/23/20 0814  Medications Prior to Admission  Medication Sig Dispense Refill Last Dose  . atorvastatin (LIPITOR) 80 MG tablet Take 80 mg by mouth daily.   08/21/2020  . carvedilol (COREG) 25 MG tablet Take 25 mg by mouth 2 (two) times daily with a meal.   08/23/2020 at 0630  . clobetasol cream (TEMOVATE) 6.29 % Apply 1 application topically 2 (two) times a week.     . clopidogrel (PLAVIX) 75 MG tablet Take 75 mg by mouth daily.   08/18/2020  . fluticasone (FLONASE) 50 MCG/ACT nasal spray Place 2 sprays into both nostrils daily.     Marland Kitchen levothyroxine (SYNTHROID) 100 MCG tablet Take 100 mcg by mouth daily before breakfast.   08/23/2020 at 0600  . metFORMIN (GLUCOPHAGE) 500 MG tablet Take 500 mg by mouth 2 (two) times daily with a meal.   08/21/2020  . Semaglutide (OZEMPIC, 0.25 OR 0.5 MG/DOSE, ) Inject 1 Dose into the skin every 7 (seven) days.   08/21/2020 at 08/20/2020  . spironolactone (ALDACTONE) 25 MG tablet Take 25 mg by mouth daily.   08/21/2020  . gabapentin (NEURONTIN) 100 MG capsule Take 100 mg by mouth 3 (three) times daily. (Patient not taking: Reported on 08/23/2020)   Not Taking at Unknown time     Allergies  Allergen Reactions  . Lisinopril   . Penicillins      Past Medical History:  Diagnosis Date  . Chronic kidney disease   . Coronary artery disease   . Diabetes mellitus without complication ([place])   . Hyperlipidemia   . Hypertension    . SOBOE (shortness of breath on exertion)     Review of systems:  Otherwise negative.    Physical Exam  Gen: Alert, oriented. Appears stated age.  HEENT: [place]/AT. PERRLA. Lungs: No respiratory distress Abd: soft, benign, no masses. BS+ Ext: No edema. Pulses 2+    Planned procedures: Proceed with colonoscopy. The patient understands the nature of the planned procedure, indications, risks, alternatives and potential complications including but not limited to bleeding, infection, perforation, damage to internal organs and possible oversedation/side effects from anesthesia. The patient agrees and gives consent to proceed.  Please refer to procedure notes for findings, recommendations and patient disposition/instructions.     Latoya Miyamoto MD, MPH Gastroenterology 08/23/2020  8:34 AM

## 2020-08-23 NOTE — Interval H&P Note (Signed)
History and Physical Interval Note:  08/23/2020 8:37 AM  [place]  has presented today for surgery, with the diagnosis of SCREENING.  The various methods of treatment have been discussed with the patient and family. After consideration of risks, benefits and other options for treatment, the patient has consented to  Procedure(s): COLONOSCOPY WITH PROPOFOL (N/A) as a surgical intervention.  The patient's history has been reviewed, patient examined, no change in status, stable for surgery.  I have reviewed the patient's chart and labs.  Questions were answered to the patient's satisfaction.     Lesly Rubenstein  Ok to proceed with colonoscopy

## 2020-08-23 NOTE — Transfer of Care (Signed)
Immediate Anesthesia Transfer of Care Note  Patient: Latoya Herman  Procedure(s) Performed: COLONOSCOPY WITH PROPOFOL (N/A )  Patient Location: PACU and Endoscopy Unit  Anesthesia Type:General  Level of Consciousness: awake and patient cooperative  Airway & Oxygen Therapy: Patient Spontanous Breathing  Post-op Assessment: Report given to RN and Post -op Vital signs reviewed and stable  Post vital signs: Reviewed and stable  Last Vitals:  Vitals Value Taken Time  BP 119/68 08/23/20 0914  Temp    Pulse 75 08/23/20 0915  Resp 15 08/23/20 0915  SpO2 99 % 08/23/20 0915  Vitals shown include unvalidated device data.  Last Pain:  Vitals:   08/23/20 0754  TempSrc: Temporal  PainSc: 0-No pain         Complications: No complications documented.

## 2020-08-24 NOTE — Progress Notes (Signed)
Voicemail. No message left.

## 2020-08-26 ENCOUNTER — Encounter: Payer: Self-pay | Admitting: Gastroenterology

## 2020-08-27 LAB — SURGICAL PATHOLOGY

## 2020-09-21 ENCOUNTER — Observation Stay: Payer: Medicare HMO

## 2020-09-21 ENCOUNTER — Emergency Department: Payer: Medicare HMO

## 2020-09-21 ENCOUNTER — Other Ambulatory Visit: Payer: Self-pay

## 2020-09-21 ENCOUNTER — Inpatient Hospital Stay
Admission: EM | Admit: 2020-09-21 | Discharge: 2020-09-23 | DRG: 065 | Disposition: A | Discharge status: Home / Self Care | Payer: Medicare HMO | Attending: Internal Medicine | Admitting: Internal Medicine

## 2020-09-21 DIAGNOSIS — I6340 Cerebral infarction due to embolism of unspecified cerebral artery: Secondary | ICD-10-CM | POA: Diagnosis not present

## 2020-09-21 DIAGNOSIS — E876 Hypokalemia: Secondary | ICD-10-CM | POA: Diagnosis present

## 2020-09-21 DIAGNOSIS — Z20822 Contact with and (suspected) exposure to covid-19: Secondary | ICD-10-CM | POA: Diagnosis present

## 2020-09-21 DIAGNOSIS — Z79899 Other long term (current) drug therapy: Secondary | ICD-10-CM

## 2020-09-21 DIAGNOSIS — R55 Syncope and collapse: Secondary | ICD-10-CM | POA: Diagnosis not present

## 2020-09-21 DIAGNOSIS — I639 Cerebral infarction, unspecified: Secondary | ICD-10-CM

## 2020-09-21 DIAGNOSIS — R4182 Altered mental status, unspecified: Secondary | ICD-10-CM | POA: Diagnosis not present

## 2020-09-21 DIAGNOSIS — Z9119 Patient's noncompliance with other medical treatment and regimen: Secondary | ICD-10-CM

## 2020-09-21 DIAGNOSIS — Z79890 Hormone replacement therapy (postmenopausal): Secondary | ICD-10-CM

## 2020-09-21 DIAGNOSIS — I248 Other forms of acute ischemic heart disease: Secondary | ICD-10-CM | POA: Diagnosis present

## 2020-09-21 DIAGNOSIS — Z981 Arthrodesis status: Secondary | ICD-10-CM

## 2020-09-21 DIAGNOSIS — E119 Type 2 diabetes mellitus without complications: Secondary | ICD-10-CM | POA: Diagnosis not present

## 2020-09-21 DIAGNOSIS — Z955 Presence of coronary angioplasty implant and graft: Secondary | ICD-10-CM

## 2020-09-21 DIAGNOSIS — Z7902 Long term (current) use of antithrombotics/antiplatelets: Secondary | ICD-10-CM

## 2020-09-21 DIAGNOSIS — E1136 Type 2 diabetes mellitus with diabetic cataract: Secondary | ICD-10-CM | POA: Diagnosis present

## 2020-09-21 DIAGNOSIS — R778 Other specified abnormalities of plasma proteins: Secondary | ICD-10-CM

## 2020-09-21 DIAGNOSIS — D72829 Elevated white blood cell count, unspecified: Secondary | ICD-10-CM | POA: Diagnosis present

## 2020-09-21 DIAGNOSIS — I2510 Atherosclerotic heart disease of native coronary artery without angina pectoris: Secondary | ICD-10-CM | POA: Diagnosis present

## 2020-09-21 DIAGNOSIS — I10 Essential (primary) hypertension: Secondary | ICD-10-CM | POA: Diagnosis present

## 2020-09-21 DIAGNOSIS — E039 Hypothyroidism, unspecified: Secondary | ICD-10-CM | POA: Diagnosis present

## 2020-09-21 DIAGNOSIS — E785 Hyperlipidemia, unspecified: Secondary | ICD-10-CM | POA: Diagnosis not present

## 2020-09-21 DIAGNOSIS — Z7984 Long term (current) use of oral hypoglycemic drugs: Secondary | ICD-10-CM

## 2020-09-21 DIAGNOSIS — E1165 Type 2 diabetes mellitus with hyperglycemia: Secondary | ICD-10-CM | POA: Diagnosis present

## 2020-09-21 DIAGNOSIS — R7989 Other specified abnormal findings of blood chemistry: Secondary | ICD-10-CM | POA: Diagnosis present

## 2020-09-21 DIAGNOSIS — E1142 Type 2 diabetes mellitus with diabetic polyneuropathy: Secondary | ICD-10-CM | POA: Diagnosis present

## 2020-09-21 HISTORY — DX: Cerebral infarction, unspecified: I63.9

## 2020-09-21 LAB — URINALYSIS, COMPLETE (UACMP) WITH MICROSCOPIC
Bacteria, UA: NONE SEEN
Bilirubin Urine: NEGATIVE
Glucose, UA: >=500 mg/dL — AB
Hgb urine dipstick: NEGATIVE
Ketones, ur: NEGATIVE mg/dL
Leukocytes,Ua: NEGATIVE
Nitrite: NEGATIVE
Protein, ur: 100 mg/dL — AB
Specific Gravity, Urine: 1.007 (ref 1.005–1.030)
Squamous Epithelial / HPF: NONE SEEN (ref 0–5)
pH: 6 (ref 5.0–8.0)

## 2020-09-21 LAB — BASIC METABOLIC PANEL
Anion gap: 11 (ref 5–15)
BUN: 16 mg/dL (ref 8–23)
CO2: 23 mmol/L (ref 22–32)
Calcium: 9.3 mg/dL (ref 8.9–10.3)
Chloride: 99 mmol/L (ref 98–111)
Creatinine, Ser: 0.93 mg/dL (ref 0.44–1.00)
GFR, Estimated: >60 mL/min (ref >60)
Glucose, Bld: 203 mg/dL — ABNORMAL HIGH (ref 70–99)
Potassium: 4 mmol/L (ref 3.5–5.1)
Sodium: 133 mmol/L — ABNORMAL LOW (ref 135–145)

## 2020-09-21 LAB — RESPIRATORY PANEL BY RT PCR (FLU A&B, COVID)
Influenza A by PCR: NEGATIVE
Influenza B by PCR: NEGATIVE
SARS Coronavirus 2 by RT PCR: NEGATIVE

## 2020-09-21 LAB — TROPONIN I (HIGH SENSITIVITY)
Troponin I (High Sensitivity): 87 ng/L — ABNORMAL HIGH (ref <18)
Troponin I (High Sensitivity): 385 ng/L (ref <18)
Troponin I (High Sensitivity): 849 ng/L (ref <18)
Troponin I (High Sensitivity): 813 ng/L (ref <18)

## 2020-09-21 LAB — CBC
HCT: 34.5 % — ABNORMAL LOW (ref 36.0–46.0)
Hemoglobin: 11.7 g/dL — ABNORMAL LOW (ref 12.0–15.0)
MCH: 30.2 pg (ref 26.0–34.0)
MCHC: 33.9 g/dL (ref 30.0–36.0)
MCV: 89.1 fL (ref 80.0–100.0)
Platelets: 358 10*3/uL (ref 150–400)
RBC: 3.87 MIL/uL (ref 3.87–5.11)
RDW: 12.7 % (ref 11.5–15.5)
WBC: 12.6 10*3/uL — ABNORMAL HIGH (ref 4.0–10.5)
nRBC: 0 % (ref 0.0–0.2)

## 2020-09-21 LAB — GLUCOSE, CAPILLARY
Glucose-Capillary: 101 mg/dL — ABNORMAL HIGH (ref 70–99)
Glucose-Capillary: 101 mg/dL — ABNORMAL HIGH (ref 70–99)

## 2020-09-21 LAB — FIBRIN DERIVATIVES D-DIMER (ARMC ONLY): Fibrin derivatives D-dimer (ARMC): 751.81 ng/mL (FEU) — ABNORMAL HIGH (ref 0.00–499.00)

## 2020-09-21 MED ORDER — SPIRONOLACTONE 25 MG PO TABS
25.0000 mg | ORAL_TABLET | ORAL | Status: DC
  Filled 2020-09-21: qty 1

## 2020-09-21 MED ORDER — HEPARIN (PORCINE) 25000 UT/250ML-% IV SOLN
800.0000 [IU]/h | INTRAVENOUS | Status: DC
  Administered 2020-09-21: 800 [IU]/h via INTRAVENOUS
  Filled 2020-09-21: qty 250

## 2020-09-21 MED ORDER — INSULIN ASPART 100 UNIT/ML ~~LOC~~ SOLN: 0.0000 [IU] | Freq: Every day | SUBCUTANEOUS | Status: DC

## 2020-09-21 MED ORDER — SODIUM CHLORIDE 0.9 % IV SOLN: INTRAVENOUS | Status: DC

## 2020-09-21 MED ORDER — ASPIRIN 81 MG PO CHEW
324.0000 mg | CHEWABLE_TABLET | Freq: Once | ORAL | Status: AC
  Administered 2020-09-21: 324 mg via ORAL
  Filled 2020-09-21: qty 4

## 2020-09-21 MED ORDER — HYDRALAZINE HCL 20 MG/ML IJ SOLN
5.0000 mg | INTRAMUSCULAR | Status: DC | PRN
  Administered 2020-09-21: 5 mg via INTRAVENOUS
  Filled 2020-09-21: qty 1

## 2020-09-21 MED ORDER — IOHEXOL 350 MG/ML SOLN
75.0000 mL | Freq: Once | INTRAVENOUS | Status: AC | PRN
  Administered 2020-09-21: 75 mL via INTRAVENOUS

## 2020-09-21 MED ORDER — SODIUM CHLORIDE 0.9 % IV BOLUS
500.0000 mL | Freq: Once | INTRAVENOUS | Status: AC
  Administered 2020-09-21: 500 mL via INTRAVENOUS

## 2020-09-21 MED ORDER — ACETAMINOPHEN 325 MG PO TABS: 650.0000 mg | ORAL_TABLET | Freq: Four times a day (QID) | ORAL | Status: DC | PRN

## 2020-09-21 MED ORDER — ONDANSETRON HCL 4 MG/2ML IJ SOLN: 4.0000 mg | Freq: Three times a day (TID) | INTRAMUSCULAR | Status: DC | PRN

## 2020-09-21 MED ORDER — CARVEDILOL 25 MG PO TABS: 25.0000 mg | ORAL_TABLET | ORAL | Status: DC

## 2020-09-21 MED ORDER — HEPARIN BOLUS VIA INFUSION
3950.0000 [IU] | Freq: Once | INTRAVENOUS | Status: AC
  Administered 2020-09-21: 3950 [IU] via INTRAVENOUS
  Filled 2020-09-21: qty 3950

## 2020-09-21 MED ORDER — INSULIN ASPART 100 UNIT/ML ~~LOC~~ SOLN: 0.0000 [IU] | Freq: Three times a day (TID) | SUBCUTANEOUS | Status: DC

## 2020-09-21 NOTE — ED Notes (Signed)
Report from [facility] at this time.

## 2020-09-21 NOTE — ED Notes (Signed)
Heparin switched to MRI pump- witnessed by Lorriane Shire, RN- pt taken to MRI at this time.

## 2020-09-21 NOTE — ED Notes (Signed)
Pt taken to CT.

## 2020-09-21 NOTE — ED Notes (Signed)
Family at bedside. Provided water to drink.

## 2020-09-21 NOTE — H&P (Addendum)
History and Physical    Latoya Herman YQM:[id number] DOB: 1951-09-12 DOA: 09/21/2020  Referring MD/NP/PA:   PCP: Kirk Ruths, MD   Patient coming from:  The patient is coming from home.  At baseline, pt is independent for most of ADL.        Chief Complaint: Dizziness, near syncope  HPI: Latoya Herman is a 69 y.o. female with medical history significant of hypertension, hyperlipidemia, diabetes mellitus, CAD, with stent placement, hypothyroidism, who presents with dizziness near syncope  Patient states that her symptoms started at about 11:00 AM, including dizziness and lightheadedness after cooking breakfast.  She states that she almost passed out, but did not. She does not feel room spinning.    No unilateral numbness or tingling in extremities.  No facial droop or slurred speech.  She has generalized weakness.  She checked her blood sugar which was 150.  Patient denies chest pain, shortness breath, cough, fever or chills. She had nausea and vomited twice.  No abdominal pain or diarrhea.  No symptoms of UTI.     ED Course: pt was found to have WBC 12.6, troponin level 87, negative urinalysis, pending Covid PCR, electrolytes renal function okay, temperature normal, blood pressure 192/83, heart rate 52, RR 18, oxygen saturation 99% on room air.  I personally reviewed chest x-ray which was negative.  CT head is negative for acute intracranial abnormalities.  Patient is placed on progressive bed of observation.   Review of Systems:   General: no fevers, chills, no body weight gain, has fatigue HEENT: no blurry vision, hearing changes or sore throat Respiratory: no dyspnea, coughing, wheezing CV: no chest pain, no palpitations GI: had nausea, vomiting, no abdominal pain, diarrhea, constipation GU: no dysuria, burning on urination, increased urinary frequency, hematuria  Ext: no leg edema Neuro: no unilateral weakness, numbness, or tingling, no vision change or hearing loss.   Dizziness and lightheadedness. Skin: no rash, no skin tear. MSK: No muscle spasm, no deformity, no limitation of range of movement in spin Heme: No easy bruising.  Travel history: No recent long distant travel.  Allergy:  Allergies  Allergen Reactions  . Lisinopril   . Penicillins     Past Medical History:  Diagnosis Date  . Chronic kidney disease   . Coronary artery disease   . Diabetes mellitus without complication ([place])   . Hyperlipidemia   . Hypertension   . SOBOE (shortness of breath on exertion)     Past Surgical History:  Procedure Laterality Date  . COLONOSCOPY  04/23/2010  . COLONOSCOPY WITH ESOPHAGOGASTRODUODENOSCOPY (EGD)  04/23/2010  . COLONOSCOPY WITH PROPOFOL N/A 08/23/2020   Procedure: COLONOSCOPY WITH PROPOFOL;  Surgeon: Lesly Rubenstein, MD;  Location: [facility] ENDOSCOPY;  Service: Endoscopy;  Laterality: N/A;  . CORONARY ANGIOPLASTY WITH STENT PLACEMENT  10/19/2013  . SUPERFICIAL LYMPH NODE BIOPSY / EXCISION  2013    Social History:  reports that she has never smoked. She has never used smokeless tobacco. She reports that she does not drink alcohol and does not use drugs.  Family History:  Family History  Problem Relation Age of Onset  . Breast cancer Neg Hx      Prior to Admission medications   Medication Sig Start Date End Date Taking? Authorizing Provider  atorvastatin (LIPITOR) 80 MG tablet Take 80 mg by mouth daily.    [provider]  carvedilol (COREG) 25 MG tablet Take 25 mg by mouth 2 (two) times daily with a meal.  [provider]  clobetasol cream (TEMOVATE) 1.61 % Apply 1 application topically 2 (two) times a week.    [provider]  clopidogrel (PLAVIX) 75 MG tablet Take 75 mg by mouth daily.    [provider]  fluticasone (FLONASE) 50 MCG/ACT nasal spray Place 2 sprays into both nostrils daily.    [provider]  gabapentin (NEURONTIN) 100 MG capsule Take 100 mg by mouth 3 (three) times  daily. Patient not taking: Reported on 08/23/2020    [provider]  levothyroxine (SYNTHROID) 100 MCG tablet Take 100 mcg by mouth daily before breakfast.    [provider]  metFORMIN (GLUCOPHAGE) 500 MG tablet Take 500 mg by mouth 2 (two) times daily with a meal.    [provider]  Semaglutide (OZEMPIC, 0.25 OR 0.5 MG/DOSE, [place]) Inject 1 Dose into the skin every 7 (seven) days.    [provider]  spironolactone (ALDACTONE) 25 MG tablet Take 25 mg by mouth daily.    [provider]    Physical Exam: Vitals:   09/21/20 1252  BP: (!) 192/83  Pulse: (!) 52  Resp: 18  Temp: 98.2 F (36.8 C)  TempSrc: Oral  SpO2: 99%  Weight: 65.8 kg  Height: 5\' 5"  (1.651 m)   General: Not in acute distress HEENT:       Eyes: PERRL, EOMI, no scleral icterus.       ENT: No discharge from the ears and nose, no pharynx injection, no tonsillar enlargement.        Neck: No JVD, no bruit, no mass felt. Heme: No neck lymph node enlargement. Cardiac: S1/S2, RRR, No murmurs, No gallops or rubs. Respiratory: No rales, wheezing, rhonchi or rubs. GI: Soft, nondistended, nontender, no rebound pain, no organomegaly, BS present. GU: No hematuria Ext: No pitting leg edema bilaterally. 2+DP/PT pulse bilaterally. Musculoskeletal: No joint deformities, No joint redness or warmth, no limitation of ROM in spin. Skin: No rashes.  Neuro: Alert, oriented X3, cranial nerves II-XII grossly intact, moves all extremities normally.  Psych: Patient is not psychotic, no suicidal or hemocidal ideation.  Labs on Admission: I have personally reviewed following labs and imaging studies  CBC: Recent Labs  Lab 09/21/20 1256  WBC 12.6*  HGB 11.7*  HCT 34.5*  MCV 89.1  PLT 096   Basic Metabolic Panel: Recent Labs  Lab 09/21/20 1256  NA 133*  K 4.0  CL 99  CO2 23  GLUCOSE 203*  BUN 16  CREATININE 0.93  CALCIUM 9.3   GFR: Estimated Creatinine Clearance: 49.9 mL/min (by  C-G formula based on SCr of 0.93 mg/dL). Liver Function Tests: No results for input(s): AST, ALT, ALKPHOS, BILITOT, PROT, ALBUMIN in the last 168 hours. No results for input(s): LIPASE, AMYLASE in the last 168 hours. No results for input(s): AMMONIA in the last 168 hours. Coagulation Profile: No results for input(s): INR, PROTIME in the last 168 hours. Cardiac Enzymes: No results for input(s): CKTOTAL, CKMB, CKMBINDEX, TROPONINI in the last 168 hours. BNP (last 3 results) No results for input(s): PROBNP in the last 8760 hours. HbA1C: No results for input(s): HGBA1C in the last 72 hours. CBG: No results for input(s): GLUCAP in the last 168 hours. Lipid Profile: No results for input(s): CHOL, HDL, LDLCALC, TRIG, CHOLHDL, LDLDIRECT in the last 72 hours. Thyroid Function Tests: No results for input(s): TSH, T4TOTAL, FREET4, T3FREE, THYROIDAB in the last 72 hours. Anemia Panel: No results for input(s): VITAMINB12, FOLATE, FERRITIN, TIBC, IRON, RETICCTPCT  in the last 72 hours. Urine analysis:    Component Value Date/Time   COLORURINE STRAW (A) 09/21/2020 1314   APPEARANCEUR CLEAR (A) 09/21/2020 1314   APPEARANCEUR Clear 04/25/2013 0945   LABSPEC 1.007 09/21/2020 1314   LABSPEC 1.010 04/25/2013 0945   PHURINE 6.0 09/21/2020 1314   GLUCOSEU >=500 (A) 09/21/2020 1314   GLUCOSEU 100 mg/dL 04/25/2013 0945   HGBUR NEGATIVE 09/21/2020 1314   BILIRUBINUR NEGATIVE 09/21/2020 1314   BILIRUBINUR Negative 04/25/2013 0945   KETONESUR NEGATIVE 09/21/2020 1314   PROTEINUR 100 (A) 09/21/2020 1314   NITRITE NEGATIVE 09/21/2020 1314   LEUKOCYTESUR NEGATIVE 09/21/2020 1314   LEUKOCYTESUR Negative 04/25/2013 0945   Sepsis Labs: @LABRCNTIP (procalcitonin:4,lacticidven:4) )No results found for this or any previous visit (from the past 240 hour(s)).   Radiological Exams on Admission: DG Chest 2 View  Result Date: 09/21/2020 CLINICAL DATA:  Dizziness. EXAM: CHEST - 2 VIEW COMPARISON:  Chest  radiograph 07/14/2005. FINDINGS: The heart size and mediastinal contours are within normal limits. Both lungs are clear. The visualized skeletal structures are unremarkable. IMPRESSION: No active cardiopulmonary disease. Electronically Signed   By: Lovey Newcomer M.D.   On: 09/21/2020 14:14   CT Head Wo Contrast  Result Date: 09/21/2020 CLINICAL DATA:  Patient with dizziness. EXAM: CT HEAD WITHOUT CONTRAST TECHNIQUE: Contiguous axial images were obtained from the base of the skull through the vertex without intravenous contrast. COMPARISON:  None. FINDINGS: Brain: Ventricles and sulci are appropriate for patient's age. No evidence for acute cortically based infarct, intracranial hemorrhage, mass lesion or mass-effect. Vascular: Unremarkable Skull: Intact. Sinuses/Orbits: Paranasal sinuses well aerated. Mastoid air cells unremarkable. Orbits unremarkable. Other: None. IMPRESSION: No acute intracranial process. Electronically Signed   By: Lovey Newcomer M.D.   On: 09/21/2020 14:25     EKG: I have personally reviewed.  Sinus rhythm, QTC 481, LAE, PVC, anteroseptal infarction pattern  Assessment/Plan Principal Problem:   Near syncope Active Problems:   Coronary artery disease   Hypertension   Hyperlipidemia   Diabetes mellitus without complication (HCC)   Elevated troponin   Leukocytosis   Near syncope:  Etiology is not clear. The differential diagnosis is broad, including vasovagal syncope, TIA, posterior circulation stroke, pulmonary embolism, arrhythmia, ACS, orthostatic status, etc. patient D-dimer is +751, will need to rule out PE.  - Place on tele bed for obs - Orthostatic vital signs  - MRI-brain - 2d echo - Neuro checks  - IVF: 500 cc of NS in ED, then NS 75 cc/h - get CTA to r/o PE - LE doppler to r/o DVT due to positive D-dimer - PT/OT eval and treat  Coronary artery disease and elevated trop: s/p of stent. Trop 87 --> 385, indicating possible non-STEMI, but patient does not have CP  or SOB. Will consult cardiology, Dr. Saralyn Pilar  -Start IV heparin - Trend Trop - Repeat EKG in the am  - prn Nitroglycerin, Morphine, lipitor - 324 mg of ASA was given in ED - pt is on plavix - Risk factor stratification: will check FLP and A1C  - 2d echo  Hypertension: bp is elevated 192/83 -IV hydralazine. -Continue Coreg, spironolactone  Hyperlipidemia -Lipitor  Diabetes mellitus without complication ([place]): Recent A1c 5.9, well controlled.  Patient is taking Metformin and Ozempic at home -SSI  Leukocytosis: WBC 12.6. No fever.  No source of infection identified, likely reactive. -Follow-up with CBC  Hypothyroidism: -Continue home Synthroid      DVT ppx: on IV Lovenox Code Status: Full code Family Communication:  Yes, patient's daughter at bed side Disposition Plan:  Anticipate discharge back to previous environment Consults called:  Card, Dr. Saralyn Pilar Admission status:   progressive unit for obs    Status is: Observation  The patient remains OBS appropriate and will d/c before 2 midnights.  Dispo: The patient is from: Home              Anticipated d/c is to: Home              Anticipated d/c date is: 1 day              Patient currently is not medically stable to d/c.         Date of Service 09/21/2020    Ivor Costa [facility]   If 7PM-7AM, please contact night-coverage [email or web address] 09/21/2020, 3:52 PM

## 2020-09-21 NOTE — ED Triage Notes (Signed)
Pt comes EMS from home after having an episode of dizziness, emesis, and diaphoresis while cooking breakfast. Pt thought her CBG was low so she ate some brown maple syrup. Fire CBG was 200 and EMS CBG after was almost 400. Pt is hypertensive and was throwing some PVCs so EMS placed 2L [place]. Pt AOx4, able to move all extremities. Remains pale.

## 2020-09-21 NOTE — ED Notes (Signed)
Pt on heparin drip, no RN floating, no tech available to cover C-pod patients. Requested floor transport.

## 2020-09-21 NOTE — ED Notes (Signed)
Pt assisted to toilet 

## 2020-09-21 NOTE — Consult Note (Addendum)
[place] for Heparin infusion Indication: chest pain/ACS  Allergies  Allergen Reactions  . Lisinopril   . Penicillins     Patient Measurements: Height: 5\' 5"  (165.1 cm) Weight: 65.8 kg (145 lb) IBW/kg (Calculated) : 57 Heparin Dosing Weight: 65.8kg  Vital Signs: Temp: 98.2 F (36.8 C) (10/09 1252) Temp Source: Oral (10/09 1252) BP: 192/83 (10/09 1252) Pulse Rate: 52 (10/09 1252)  Labs: Recent Labs    09/21/20 1256 09/21/20 1522  HGB 11.7*  --   HCT 34.5*  --   PLT 358  --   CREATININE 0.93  --   TROPONINIHS 87* 385*    Estimated Creatinine Clearance: 49.9 mL/min (by C-G formula based on SCr of 0.93 mg/dL).   Medical History: Past Medical History:  Diagnosis Date  . Chronic kidney disease   . Coronary artery disease   . Diabetes mellitus without complication ([place])   . Hyperlipidemia   . Hypertension   . SOBOE (shortness of breath on exertion)     Medications:  Per chart review and patient's husband, patient not on anticoagulation prior to admission  Assessment: 69yo female presented with PMH significant for HTN, HLD, DM, CAD w/ stent placement, and hypothyroidism presented with near syncope and dizziness. Pharmacy has been consulted for heparin dosing and monitoring.  Hgb 11.7 Plt 358 aPTT and PT/INR pending  Goal of Therapy:  Heparin level 0.3-0.7 units/ml Monitor platelets by anticoagulation protocol: Yes   Plan:  Give 3950 units bolus x 1 Start heparin infusion at 800 units/hr Check anti-Xa level in 8 hours and daily while on heparin Continue to monitor H&H and platelets  Sherilyn Banker, PharmD Pharmacy Resident  09/21/2020 4:31 PM

## 2020-09-21 NOTE — ED Notes (Signed)
Pt remains in [place]

## 2020-09-21 NOTE — ED Provider Notes (Signed)
[facility] Emergency Department Provider Note   ____________________________________________   First MD Initiated Contact with Patient 09/21/20 1257     (approximate)  I have reviewed the triage vital signs and the nursing notes.   HISTORY  Chief Complaint Near Syncope, Emesis, and Dizziness    HPI Latoya Herman is a 69 y.o. female for evaluation of feeling very lightheaded and dizzy after cooking breakfast  Patient reports that she got up this morning, she gets up late, she went into the kitchen and was preparing a meal when she started feeling very lightheaded.  She felt as though she was going to pass out, got very dizzy.  Reports it did not feel like a spinning feeling but rather like a feeling as though she was getting lightheaded.  She had to lay on the floor, and it took her several minutes to start to feel better.  She reports that during this time she felt nauseated and very sweaty.  No chest pain or trouble breathing.  No headache.  No numbness or tingling.  Did not have any trouble speaking  No facial droop.  Reports a similar symptom happened to her several months ago  and she reports she went to [facility] ER for this, had blood work done and was discharged without knowing the cause  Past Medical History:  Diagnosis Date  . Chronic kidney disease   . Coronary artery disease   . Diabetes mellitus without complication ([place])   . Hyperlipidemia   . Hypertension   . SOBOE (shortness of breath on exertion)     There are no problems to display for this patient.   Past Surgical History:  Procedure Laterality Date  . COLONOSCOPY  04/23/2010  . COLONOSCOPY WITH ESOPHAGOGASTRODUODENOSCOPY (EGD)  04/23/2010  . COLONOSCOPY WITH PROPOFOL N/A 08/23/2020   Procedure: COLONOSCOPY WITH PROPOFOL;  Surgeon: Lesly Rubenstein, MD;  Location: [facility] ENDOSCOPY;  Service: Endoscopy;  Laterality: N/A;  . CORONARY ANGIOPLASTY WITH STENT PLACEMENT  10/19/2013    . SUPERFICIAL LYMPH NODE BIOPSY / EXCISION  2013    Prior to Admission medications   Medication Sig Start Date End Date Taking? Authorizing Provider  atorvastatin (LIPITOR) 80 MG tablet Take 80 mg by mouth daily.    [provider]  carvedilol (COREG) 25 MG tablet Take 25 mg by mouth 2 (two) times daily with a meal.    [provider]  clobetasol cream (TEMOVATE) 4.48 % Apply 1 application topically 2 (two) times a week.    [provider]  clopidogrel (PLAVIX) 75 MG tablet Take 75 mg by mouth daily.    [provider]  fluticasone (FLONASE) 50 MCG/ACT nasal spray Place 2 sprays into both nostrils daily.    [provider]  gabapentin (NEURONTIN) 100 MG capsule Take 100 mg by mouth 3 (three) times daily. Patient not taking: Reported on 08/23/2020    [provider]  levothyroxine (SYNTHROID) 100 MCG tablet Take 100 mcg by mouth daily before breakfast.    [provider]  metFORMIN (GLUCOPHAGE) 500 MG tablet Take 500 mg by mouth 2 (two) times daily with a meal.    [provider]  Semaglutide (OZEMPIC, 0.25 OR 0.5 MG/DOSE, [place]) Inject 1 Dose into the skin every 7 (seven) days.    [provider]  spironolactone (ALDACTONE) 25 MG tablet Take 25 mg by mouth daily.    [provider]    Allergies Lisinopril and Penicillins  Family History  Problem  Relation Age of Onset  . Breast cancer Neg Hx     Social History Social History   Tobacco Use  . Smoking status: Never Smoker  . Smokeless tobacco: Never Used  Substance Use Topics  . Alcohol use: Never  . Drug use: Never    Review of Systems Constitutional: No fever/chills or recent illness Eyes: No visual changes. ENT: No sore throat.  No neck pain. Cardiovascular: Denies chest pain. Respiratory: Denies shortness of breath. Gastrointestinal: No abdominal pain.  Felt nauseated during the episode but feels fine now. Genitourinary: Negative for  dysuria. Musculoskeletal: Negative for back pain. Skin: Negative for rash. Neurological: Negative for headaches, areas of focal weakness or numbness.    ____________________________________________   PHYSICAL EXAM:  VITAL SIGNS: ED Triage Vitals [09/21/20 1252]  Enc Vitals Group     BP (!) 192/83     Pulse Rate (!) 52     Resp 18     Temp 98.2 F (36.8 C)     Temp Source Oral     SpO2 99 %     Weight 145 lb (65.8 kg)     Height 5\' 5"  (1.651 m)     Head Circumference      Peak Flow      Pain Score 0     Pain Loc      Pain Edu?      Excl. in [place]?     Constitutional: Alert and oriented. Well appearing and in no acute distress.  She is very pleasant.  Reports she feels better now.  Received 500 mL normal saline with EMS. Eyes: Conjunctivae are normal. Head: Atraumatic. Nose: No congestion/rhinnorhea. Mouth/Throat: Mucous membranes are moist. Neck: No stridor.  Cardiovascular: Normal rate, regular rhythm. Grossly normal heart sounds.  Good peripheral circulation. Respiratory: Normal respiratory effort.  No retractions. Lungs CTAB. Gastrointestinal: Soft and nontender. No distention. Musculoskeletal: No lower extremity tenderness nor edema. Neurologic:  Normal speech and language. No gross focal neurologic deficits are appreciated.  No pronator drift in extremity.  5-5 strength all extremities.  Symmetric smile.  Normal cranial nerve exam.  Normal extraocular movements.  Normal speech.  Normal sensation face hands legs Skin:  Skin is warm, dry and intact. No rash noted. Psychiatric: Mood and affect are normal. Speech and behavior are normal.  ____________________________________________   LABS (all labs ordered are listed, but only abnormal results are displayed)  Labs Reviewed  BASIC METABOLIC PANEL - Abnormal; Notable for the following components:      Result Value   Sodium 133 (*)    Glucose, Bld 203 (*)    All other components within normal limits  CBC -  Abnormal; Notable for the following components:   WBC 12.6 (*)    Hemoglobin 11.7 (*)    HCT 34.5 (*)    All other components within normal limits  URINALYSIS, COMPLETE (UACMP) WITH MICROSCOPIC - Abnormal; Notable for the following components:   Color, Urine STRAW (*)    APPearance CLEAR (*)    Glucose, UA >=500 (*)    Protein, ur 100 (*)    All other components within normal limits  TROPONIN I (HIGH SENSITIVITY) - Abnormal; Notable for the following components:   Troponin I (High Sensitivity) 87 (*)    All other components within normal limits  RESPIRATORY PANEL BY RT PCR (FLU A&B, COVID)  TROPONIN I (HIGH SENSITIVITY)   ____________________________________________  EKG  EKG is reviewed interpreted at 1300 Heart rate 85 QRS 80 QTc  480 Normal sinus rhythm, occasional PVC.  No evidence of acute ischemia denoted. ____________________________________________  [id number]  DG Chest 2 View  Result Date: 09/21/2020 CLINICAL DATA:  Dizziness. EXAM: CHEST - 2 VIEW COMPARISON:  Chest radiograph 07/14/2005. FINDINGS: The heart size and mediastinal contours are within normal limits. Both lungs are clear. The visualized skeletal structures are unremarkable. IMPRESSION: No active cardiopulmonary disease. Electronically Signed   By: Lovey Newcomer M.D.   On: 09/21/2020 14:14   CT Head Wo Contrast  Result Date: 09/21/2020 CLINICAL DATA:  Patient with dizziness. EXAM: CT HEAD WITHOUT CONTRAST TECHNIQUE: Contiguous axial images were obtained from the base of the skull through the vertex without intravenous contrast. COMPARISON:  None. FINDINGS: Brain: Ventricles and sulci are appropriate for patient's age. No evidence for acute cortically based infarct, intracranial hemorrhage, mass lesion or mass-effect. Vascular: Unremarkable Skull: Intact. Sinuses/Orbits: Paranasal sinuses well aerated. Mastoid air cells unremarkable. Orbits unremarkable. Other: None. IMPRESSION: No acute intracranial process.  Electronically Signed   By: Lovey Newcomer M.D.   On: 09/21/2020 14:25      CT head and chest x-ray reviewed by me, no acute process seen in either. ____________________________________________   PROCEDURES  Procedure(s) performed: None  Procedures  Critical Care performed: No  ____________________________________________   INITIAL IMPRESSION / ASSESSMENT AND PLAN / ED COURSE  Pertinent labs & imaging results that were available during my care of the patient were reviewed by me and considered in my medical decision making (see chart for details).   Patient presents after a near syncopal or lightheaded episode but seems to have resolved now.  Had something similar several months ago but was seen but never actually evaluated by physician at [facility].  She seems to be recovering now, reassuring exam.  She is diabetic, and of note on her laboratory testing her troponin is surprisingly elevated.  No associated chest pain or EKG does not demonstrate obvious ischemia.  She does follow with cardiology.  She has not yet had her blood pressure medications today, and her orthostatics and seem to be reassuring.  Discussed with the patient and her daughter, and given her elevated troponin I recommend observation for further work-up and evaluation with our hospitalist service and possibly cardiology.  Consideration for arrhythmia, silent MI, or other causes are considered.  ----------------------------------------- 2:37 PM on 09/21/2020 -----------------------------------------  Patient and daughter agreeable with plan for admission  Admission discussed with Dr. Alfredia Client was evaluated in Emergency Department on 09/21/2020 for the symptoms described in the history of present illness. She was evaluated in the context of the global COVID-19 pandemic, which necessitated consideration that the patient might be at risk for infection with the SARS-CoV-2 virus that causes COVID-19. Institutional  protocols and algorithms that pertain to the evaluation of patients at risk for COVID-19 are in a state of rapid change based on information released by regulatory bodies including the CDC and federal and state organizations. These policies and algorithms were followed during the patient's care in the ED.       ____________________________________________   FINAL CLINICAL IMPRESSION(S) / ED DIAGNOSES  Final diagnoses:  Near syncope  Elevated troponin I level        Note:  This document was prepared using Dragon voice recognition software and may include unintentional dictation errors       Delman Kitten, MD 09/21/20 1456

## 2020-09-22 ENCOUNTER — Inpatient Hospital Stay
Admit: 2020-09-22 | Discharge: 2020-09-22 | Disposition: A | Discharge status: Home / Self Care | Payer: Medicare HMO | Attending: Neurology | Admitting: Neurology

## 2020-09-22 ENCOUNTER — Inpatient Hospital Stay: Payer: Medicare HMO

## 2020-09-22 ENCOUNTER — Encounter: Payer: Self-pay | Admitting: Internal Medicine

## 2020-09-22 DIAGNOSIS — Z79890 Hormone replacement therapy (postmenopausal): Secondary | ICD-10-CM | POA: Diagnosis not present

## 2020-09-22 DIAGNOSIS — Z20822 Contact with and (suspected) exposure to covid-19: Secondary | ICD-10-CM | POA: Diagnosis present

## 2020-09-22 DIAGNOSIS — E039 Hypothyroidism, unspecified: Secondary | ICD-10-CM | POA: Diagnosis present

## 2020-09-22 DIAGNOSIS — Z7902 Long term (current) use of antithrombotics/antiplatelets: Secondary | ICD-10-CM | POA: Diagnosis not present

## 2020-09-22 DIAGNOSIS — D72829 Elevated white blood cell count, unspecified: Secondary | ICD-10-CM | POA: Diagnosis present

## 2020-09-22 DIAGNOSIS — R55 Syncope and collapse: Secondary | ICD-10-CM | POA: Diagnosis not present

## 2020-09-22 DIAGNOSIS — Z9119 Patient's noncompliance with other medical treatment and regimen: Secondary | ICD-10-CM | POA: Diagnosis not present

## 2020-09-22 DIAGNOSIS — I6340 Cerebral infarction due to embolism of unspecified cerebral artery: Secondary | ICD-10-CM | POA: Diagnosis present

## 2020-09-22 DIAGNOSIS — E876 Hypokalemia: Secondary | ICD-10-CM | POA: Diagnosis present

## 2020-09-22 DIAGNOSIS — I639 Cerebral infarction, unspecified: Secondary | ICD-10-CM | POA: Diagnosis present

## 2020-09-22 DIAGNOSIS — R4182 Altered mental status, unspecified: Secondary | ICD-10-CM | POA: Diagnosis present

## 2020-09-22 DIAGNOSIS — E1165 Type 2 diabetes mellitus with hyperglycemia: Secondary | ICD-10-CM | POA: Diagnosis present

## 2020-09-22 DIAGNOSIS — I248 Other forms of acute ischemic heart disease: Secondary | ICD-10-CM | POA: Diagnosis present

## 2020-09-22 DIAGNOSIS — Z981 Arthrodesis status: Secondary | ICD-10-CM | POA: Diagnosis not present

## 2020-09-22 DIAGNOSIS — E785 Hyperlipidemia, unspecified: Secondary | ICD-10-CM | POA: Diagnosis present

## 2020-09-22 DIAGNOSIS — E1136 Type 2 diabetes mellitus with diabetic cataract: Secondary | ICD-10-CM | POA: Diagnosis present

## 2020-09-22 DIAGNOSIS — I10 Essential (primary) hypertension: Secondary | ICD-10-CM | POA: Diagnosis present

## 2020-09-22 DIAGNOSIS — Z79899 Other long term (current) drug therapy: Secondary | ICD-10-CM | POA: Diagnosis not present

## 2020-09-22 DIAGNOSIS — E1142 Type 2 diabetes mellitus with diabetic polyneuropathy: Secondary | ICD-10-CM | POA: Diagnosis present

## 2020-09-22 DIAGNOSIS — Z7984 Long term (current) use of oral hypoglycemic drugs: Secondary | ICD-10-CM | POA: Diagnosis not present

## 2020-09-22 DIAGNOSIS — Z955 Presence of coronary angioplasty implant and graft: Secondary | ICD-10-CM | POA: Diagnosis not present

## 2020-09-22 DIAGNOSIS — I2510 Atherosclerotic heart disease of native coronary artery without angina pectoris: Secondary | ICD-10-CM | POA: Diagnosis present

## 2020-09-22 LAB — GLUCOSE, CAPILLARY
Glucose-Capillary: 95 mg/dL (ref 70–99)
Glucose-Capillary: 116 mg/dL — ABNORMAL HIGH (ref 70–99)

## 2020-09-22 LAB — CBC
HCT: 35.3 % — ABNORMAL LOW (ref 36.0–46.0)
Hemoglobin: 11.8 g/dL — ABNORMAL LOW (ref 12.0–15.0)
MCH: 29.8 pg (ref 26.0–34.0)
MCHC: 33.4 g/dL (ref 30.0–36.0)
MCV: 89.1 fL (ref 80.0–100.0)
Platelets: 364 10*3/uL (ref 150–400)
RBC: 3.96 MIL/uL (ref 3.87–5.11)
RDW: 12.6 % (ref 11.5–15.5)
WBC: 12.2 10*3/uL — ABNORMAL HIGH (ref 4.0–10.5)
nRBC: 0 % (ref 0.0–0.2)

## 2020-09-22 LAB — BASIC METABOLIC PANEL
Anion gap: 12 (ref 5–15)
BUN: 16 mg/dL (ref 8–23)
CO2: 26 mmol/L (ref 22–32)
Calcium: 9.4 mg/dL (ref 8.9–10.3)
Chloride: 100 mmol/L (ref 98–111)
Creatinine, Ser: 0.87 mg/dL (ref 0.44–1.00)
GFR, Estimated: >60 mL/min (ref >60)
Glucose, Bld: 109 mg/dL — ABNORMAL HIGH (ref 70–99)
Potassium: 3.2 mmol/L — ABNORMAL LOW (ref 3.5–5.1)
Sodium: 138 mmol/L (ref 135–145)

## 2020-09-22 LAB — APTT: aPTT: 63 seconds — ABNORMAL HIGH (ref 24–36)

## 2020-09-22 LAB — ECHOCARDIOGRAM COMPLETE
AR max vel: 1.56 cm2
AV Area VTI: 1.47 cm2
AV Area mean vel: 1.43 cm2
AV Mean grad: 4 mmHg
AV Peak grad: 7.2 mmHg
Ao pk vel: 1.34 m/s
Area-P 1/2: 3.95 cm2
Height: 65 in
S' Lateral: 2.67 cm
Weight: 2193.6 oz

## 2020-09-22 LAB — HEMOGLOBIN A1C
Hgb A1c MFr Bld: 6.5 % — ABNORMAL HIGH (ref 4.8–5.6)
Mean Plasma Glucose: 139.85 mg/dL

## 2020-09-22 LAB — URINE DRUG SCREEN, QUALITATIVE (ARMC ONLY)
Amphetamines, Ur Screen: NOT DETECTED
Barbiturates, Ur Screen: NOT DETECTED
Benzodiazepine, Ur Scrn: NOT DETECTED
Cannabinoid 50 Ng, Ur ~~LOC~~: NOT DETECTED
Cocaine Metabolite,Ur ~~LOC~~: NOT DETECTED
MDMA (Ecstasy)Ur Screen: NOT DETECTED
Methadone Scn, Ur: NOT DETECTED
Opiate, Ur Screen: NOT DETECTED
Phencyclidine (PCP) Ur S: NOT DETECTED
Tricyclic, Ur Screen: NOT DETECTED

## 2020-09-22 LAB — LIPID PANEL
Cholesterol: 178 mg/dL (ref 0–200)
HDL: 41 mg/dL (ref >40)
LDL Cholesterol: 95 mg/dL (ref 0–99)
Total CHOL/HDL Ratio: 4.3 RATIO
Triglycerides: 208 mg/dL — ABNORMAL HIGH (ref <150)
VLDL: 42 mg/dL — ABNORMAL HIGH (ref 0–40)

## 2020-09-22 LAB — PROTIME-INR
INR: 0.9 (ref 0.8–1.2)
Prothrombin Time: 12 seconds (ref 11.4–15.2)

## 2020-09-22 LAB — HEPARIN LEVEL (UNFRACTIONATED)
Heparin Unfractionated: 0.42 IU/mL (ref 0.30–0.70)
Heparin Unfractionated: 0.29 IU/mL — ABNORMAL LOW (ref 0.30–0.70)

## 2020-09-22 LAB — TROPONIN I (HIGH SENSITIVITY): Troponin I (High Sensitivity): 400 ng/L (ref <18)

## 2020-09-22 MED ORDER — ENOXAPARIN SODIUM 40 MG/0.4ML ~~LOC~~ SOLN: 40.0000 mg | SUBCUTANEOUS | Status: DC

## 2020-09-22 MED ORDER — FLUTICASONE PROPIONATE 50 MCG/ACT NA SUSP
2.0000 | Freq: Every day | NASAL | Status: DC | PRN
  Filled 2020-09-22: qty 16

## 2020-09-22 MED ORDER — ENOXAPARIN SODIUM 40 MG/0.4ML ~~LOC~~ SOLN
40.0000 mg | SUBCUTANEOUS | Status: DC
  Administered 2020-09-22: 40 mg via SUBCUTANEOUS
  Filled 2020-09-22: qty 0.4

## 2020-09-22 MED ORDER — LEVOTHYROXINE SODIUM 100 MCG PO TABS
100.0000 ug | ORAL_TABLET | Freq: Every day | ORAL | Status: DC
  Administered 2020-09-22 – 2020-09-23 (×2): 100 ug via ORAL
  Filled 2020-09-22 (×2): qty 1

## 2020-09-22 MED ORDER — POTASSIUM CHLORIDE CRYS ER 20 MEQ PO TBCR
40.0000 meq | EXTENDED_RELEASE_TABLET | Freq: Once | ORAL | Status: AC
  Administered 2020-09-22: 40 meq via ORAL
  Filled 2020-09-22: qty 2

## 2020-09-22 MED ORDER — IOHEXOL 350 MG/ML SOLN
75.0000 mL | Freq: Once | INTRAVENOUS | Status: AC | PRN
  Administered 2020-09-22: 75 mL via INTRAVENOUS

## 2020-09-22 MED ORDER — SPIRONOLACTONE 25 MG PO TABS
25.0000 mg | ORAL_TABLET | Freq: Every day | ORAL | Status: DC
  Administered 2020-09-22 – 2020-09-23 (×2): 25 mg via ORAL
  Filled 2020-09-22 (×2): qty 1

## 2020-09-22 MED ORDER — SODIUM CHLORIDE 0.9% FLUSH
3.0000 mL | Freq: Two times a day (BID) | INTRAVENOUS | Status: DC
  Administered 2020-09-22 – 2020-09-23 (×2): 3 mL via INTRAVENOUS

## 2020-09-22 MED ORDER — CARVEDILOL 25 MG PO TABS
25.0000 mg | ORAL_TABLET | Freq: Two times a day (BID) | ORAL | Status: DC
  Administered 2020-09-22 – 2020-09-23 (×3): 25 mg via ORAL
  Filled 2020-09-22 (×3): qty 1

## 2020-09-22 MED ORDER — ASPIRIN 81 MG PO CHEW
81.0000 mg | CHEWABLE_TABLET | Freq: Every day | ORAL | Status: DC
  Administered 2020-09-23: 81 mg via ORAL
  Filled 2020-09-22: qty 1

## 2020-09-22 MED ORDER — CLOPIDOGREL BISULFATE 75 MG PO TABS
75.0000 mg | ORAL_TABLET | Freq: Every day | ORAL | Status: DC
  Administered 2020-09-22 – 2020-09-23 (×2): 75 mg via ORAL
  Filled 2020-09-22 (×2): qty 1

## 2020-09-22 MED ORDER — ATORVASTATIN CALCIUM 80 MG PO TABS
80.0000 mg | ORAL_TABLET | Freq: Every day | ORAL | Status: DC
  Administered 2020-09-22: 80 mg via ORAL
  Filled 2020-09-22 (×2): qty 1
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
## 2020-09-22 NOTE — Consult Note (Signed)
[facility] for Heparin infusion Indication: chest pain/ACS  Allergies  Allergen Reactions  . Penicillins Swelling and Other (See Comments)  . Lisinopril Rash    Possible rash Possible rash     Patient Measurements: Height: 5\' 5"  (165.1 cm) Weight: 65.8 kg (145 lb) IBW/kg (Calculated) : 57 Heparin Dosing Weight: 65.8kg  Vital Signs: Temp: 99.2 F (37.3 C) (10/10 0006) Temp Source: Oral (10/10 0006) BP: 175/87 (10/10 0024) Pulse Rate: 106 (10/10 0024)  Labs: Recent Labs    09/21/20 1256 09/21/20 1256 09/21/20 1522 09/21/20 1849 09/21/20 2110 09/22/20 0203  HGB 11.7*  --   --   --   --  11.8*  HCT 34.5*  --   --   --   --  35.3*  PLT 358  --   --   --   --  364  APTT  --   --   --   --   --  63*  LABPROT  --   --   --   --   --  12.0  INR  --   --   --   --   --  0.9  HEPARINUNFRC  --   --   --   --   --  0.42  CREATININE 0.93  --   --   --   --  0.87  TROPONINIHS 87*   < > 385* 849* 813*  --    < > = values in this interval not displayed.    Estimated Creatinine Clearance: 53.4 mL/min (by C-G formula based on SCr of 0.87 mg/dL).   Medical History: Past Medical History:  Diagnosis Date  . Chronic kidney disease   . Coronary artery disease   . Diabetes mellitus without complication ([place])   . Hyperlipidemia   . Hypertension   . SOBOE (shortness of breath on exertion)     Medications:  Per chart review and patient's husband, patient not on anticoagulation prior to admission  Assessment: 69yo female presented with PMH significant for HTN, HLD, DM, CAD w/ stent placement, and hypothyroidism presented with near syncope and dizziness. Pharmacy has been consulted for heparin dosing and monitoring.  Hgb 11.7 Plt 358 aPTT and PT/INR pending  Goal of Therapy:  Heparin level 0.3-0.7 units/ml Monitor platelets by anticoagulation protocol: Yes   Plan:  Give 3950 units bolus x 1 Start heparin infusion at 800  units/hr Check anti-Xa level in 8 hours and daily while on heparin Continue to monitor H&H and platelets  10/10:  HL @ 0203 = 0.42 Will continue pt on current rate and recheck HL in 8 hrs.   Kalif Kattner D, PharmD 09/22/2020 3:05 AM

## 2020-09-22 NOTE — Consult Note (Addendum)
Neurology Consultation Reason for Consult: Right cerebellar stroke  Requesting Physician: Enzo Bi  CC: Dizziness   History is obtained from: Patient and chart review   HPI: Latoya Herman is a 69 y.o. female with past medical history significant for hypertension, hyperlipidemia, diabetes complicated by peripheral neuropathy, coronary artery disease status post LAD stent (2014, on Plavix monotherapy and atorvastatin 80), hypothyroidism, cervical spine fusion (~2011) prior OSA diagnosis on CPAP now non-compliant (unit aged and she didn't have time to re-do sleep study).   At 11:30 AM on 10/9 she experienced sudden onset dizziness and lightheadedness, nausea, vomiting, and diaphoresis. This lasted about an hour. Remained nauseated on arrival to the ED but this improved with anti-nausea medication. BP at home gets up to 190s (steadily rising since mid Sept on her home checks). Had not taken all her meds as she had woken up late and took just her synthroid at first.   4 months ago had a similar episode, passed out and had loss of consciousness, went to [facility] ED; BP was in the 200s and she had EKG and was discharged after BP improved, had blurred vision to the degree that she couldn't see her phone. Improved with BP control. Ophthalmologist found cataracts   ROS: Spontaneously reports: legs weak (right leg and pain, this had happened previously); both legs then felt weak more recently but this has resolved Regular flu shot at [facility] about a week ago Has been going to bed early due to fatigue (7 PM - 9 PM nap, then up to 2 AM to 8-10 AM)  Pfizer vaccine completed in Jan 2021 and booster shot scheduled Headaches (-) Loss of vision / double vision (-) Tongue biting, (-) Urinary retention (reports "weak bladder" at baseline with some retention) Constipation or diarrhea ((Since colonoscopy Sept 10th w/ polyp removal, has had stable constipation, managing with Miralax intermittently)    Urinary/bowel incontinence (-) Blood in urine/stool (-) Burning with urination (-) Fevers (-) Chills (-) Cough (-) Cold (-) Rash/Skin changes: Petetchial rash on the LLE, improved on bilateral flank, started a month ago, itches, comes and goes, improves with calamine lotion  Right middle finger had some stiffening and inflammation which has improved with good diet control  Mild sharp in the left anterior chest pain once while here lasted one second, no SOB, no palpitations,  Exercises daily, walking 20 min/day Right lower extremity soreness had been bothering her but has improved   Evaluation revealed an elevated troponin, which continued to rise and peaked at 849; now downtrending; there were no EKG changes or chest pain.  MRI brain revealed small right cerebellar infarcts for which neurology is consulted.  CTA PE protocol was completed at 4:16 PM and demonstrated her known coronary artery disease without PE.  LKW: 11 AM on 10/9  tPA given?: No, due symptoms rapidly resolving  Premorbid modified rankin scale:      0 - No symptoms.    Past Medical History:  Diagnosis Date  . Chronic kidney disease   . Coronary artery disease   . Diabetes mellitus without complication ([employer])   . Hyperlipidemia   . Hypertension   . SOBOE (shortness of breath on exertion)    Past Surgical History:  Procedure Laterality Date  . COLONOSCOPY  04/23/2010  . COLONOSCOPY WITH ESOPHAGOGASTRODUODENOSCOPY (EGD)  04/23/2010  . COLONOSCOPY WITH PROPOFOL N/A 08/23/2020   Procedure: COLONOSCOPY WITH PROPOFOL;  Surgeon: Lesly Rubenstein, MD;  Location: [facility] ENDOSCOPY;  Service: Endoscopy;  Laterality:  N/A;  . CORONARY ANGIOPLASTY WITH STENT PLACEMENT  10/19/2013  . SUPERFICIAL LYMPH NODE BIOPSY / EXCISION  2013  - finger tendon surgery bilatearlly    Current Meds  Medication Sig  . atorvastatin (LIPITOR) 80 MG tablet Take 80 mg by mouth daily.  . carvedilol (COREG) 25 MG tablet Take 25 mg by mouth 2  (two) times daily with a meal.  . clopidogrel (PLAVIX) 75 MG tablet Take 75 mg by mouth daily.  Marland Kitchen levothyroxine (SYNTHROID) 100 MCG tablet Take 100 mcg by mouth daily before breakfast.  . metFORMIN (GLUCOPHAGE) 1000 MG tablet Take 500 mg by mouth 2 (two) times daily.  Marland Kitchen spironolactone (ALDACTONE) 25 MG tablet Take 25 mg by mouth daily.    Current Facility-Administered Medications:  .  acetaminophen (TYLENOL) tablet 650 mg, 650 mg, Oral, Q6H PRN, Ivor Costa, MD .  atorvastatin (LIPITOR) tablet 80 mg, 80 mg, Oral, Daily, Ivor Costa, MD .  carvedilol (COREG) tablet 25 mg, 25 mg, Oral, BID WC, Ivor Costa, MD, 25 mg at 09/22/20 5859 .  clopidogrel (PLAVIX) tablet 75 mg, 75 mg, Oral, Daily, Ivor Costa, MD, 75 mg at 09/22/20 0852 .  fluticasone (FLONASE) 50 MCG/ACT nasal spray 2 spray, 2 spray, Each Nare, Daily PRN, Ivor Costa, MD .  hydrALAZINE (APRESOLINE) injection 5 mg, 5 mg, Intravenous, Q2H PRN, Ivor Costa, MD, 5 mg at 09/21/20 2323 .  levothyroxine (SYNTHROID) tablet 100 mcg, 100 mcg, Oral, Q0600, Ivor Costa, MD, 100 mcg at 09/22/20 0541 .  ondansetron (ZOFRAN) injection 4 mg, 4 mg, Intravenous, Q8H PRN, Ivor Costa, MD .  sodium chloride flush (NS) 0.9 % injection 3 mL, 3 mL, Intravenous, Q12H, Ivor Costa, MD .  spironolactone (ALDACTONE) tablet 25 mg, 25 mg, Oral, Daily, Ivor Costa, MD, 25 mg at 09/22/20 2924  Family History  Problem Relation Age of Onset  . Breast cancer Neg Hx   Mother died at 77 of strokes and aortic surgery Father, heavy smoker, COPD, died at 31  Sister died at 34 of MI   Social History:  reports that she has never smoked. She has never used smokeless tobacco. She reports that she does not drink alcohol and does not use drugs. Enjoys gardening    Exam: Current vital signs: BP (!) 157/75 (BP Location: Left Arm)   Pulse 82   Temp 98.2 F (36.8 C) (Oral)   Resp 18   Ht 5' 5"  (1.651 m)   Wt 62.2 kg   SpO2 98%   BMI 22.81 kg/m  Vital signs in last 24  hours: Temp:  [98.2 F (36.8 C)-99.2 F (37.3 C)] 98.2 F (36.8 C) (10/10 0747) Pulse Rate:  [52-106] 82 (10/10 0747) Resp:  [12-22] 18 (10/10 0747) BP: (132-192)/(65-99) 157/75 (10/10 0747) SpO2:  [95 %-100 %] 98 % (10/10 0747) Weight:  [62.2 kg-65.8 kg] 62.2 kg (10/10 0500)   Physical Exam  Constitutional: Appears well-developed and well-nourished.  Psych: Affect appropriate to situation Eyes: No scleral injection HENT: No OP obstruction, fair dentition MSK: no joint deformities.  Cardiovascular: Normal rate and regular rhythm.  Respiratory: Effort normal, non-labored breathing GI: Soft.  No distension. There is no tenderness.  Skin: Petetchial rash on the LLE, mild scratching healing, none on bilateral flanks  Neuro: Mental Status: Patient is awake, alert, oriented to person, place, month, year, and situation. Patient is able to give a clear and coherent history. No signs of aphasia or neglect Cranial Nerves: II: Visual Fields are full. Pupils are equal, round,  and reactive to light.   III,IV, VI: EOMI mild bilateral ptosis baseline per patient, mild restricted upgaze, no diploplia.  V: Facial sensation is symmetric  VII: Facial movement is symmetric.  VIII: hearing is intact to voice X: Uvula elevates symmetrically XI: Shoulder shrug is symmetric. XII: tongue is midline without atrophy or fasciculations.  Motor: Tone is normal. Bulk is normal. 5/5 strength was present in all four extremities. Other than right knee 4/5 due to pain  Sensory: Sensation is symmetric to light touch and temperature in the arms and legs, length dependent loss of sensation  Deep Tendon Reflexes: 2+ and symmetric in the biceps, triceps, brachioradialis, achilles and patellae, No clonus  Cerebellar: FNF and HKS are intact bilaterally   I have reviewed labs in epic and the results pertinent to this consultation are:  Mild leukocytosis to 12.2 RVP negative UA with elevated glucose, protein,  otherwise negative   Lab Results  Component Value Date   HGBA1C 6.5 (H) 09/21/2020   Lab Results  Component Value Date   CHOL 178 09/22/2020   HDL 41 09/22/2020   LDLCALC 95 09/22/2020   TRIG 208 (H) 09/22/2020   CHOLHDL 4.3 09/22/2020   D-Dimer 751.81  Results for RUTA, CAPECE (MRN [id number]) as of 09/22/2020 08:42  Ref. Range 09/21/2020 15:22 09/21/2020 18:49 09/21/2020 21:10  Troponin I (High Sensitivity) Latest Ref Range: <18 ng/L 385 (HH) 849 (HH) 813 (HH)   PTT 63 at 2 AM   CTA neg for PE  [place] LE negative for DVT   I have reviewed the images obtained: CT head notable for mild chronic microvascular disease   MRI brain with small right cerebellar stroke AICA or PICA territory (1st image, DWI), remote L pontine stroke (2nd image, T2), no evidence of hemorrhagic conversion     Impression: 69 year old with multiple vascular risk factors as above. Likely embolic stroke, atheroembolic vs. cardioembolic. Needs vessel imaging, ECHO to eval for intracardiac thrombus and 30 day event monitor on discharge if no Atrial fibrillation captured on telemetry here.   Recommendations:  # Right cerebellar stroke - Stroke labs TSH, ESR, RPR, HgbA1c, fasting lipid panel  -LDL 90 despite maximum dose of atorvastatin, please consider referral to lipid clinic for PSK 9 inhibitor - MRI brain completed  - CTA head and neck - q4hr neurochecks - Echocardiogram pending - Stop heparin drip, no therapeutic AC for the next 5-10 days unless there is a life-threatening indication (and in this case please use a low-goal no-bolus protocol) - Continue Plavix 75 mg daily,  - Patient has received ASA 325 mg at 15:42, continue 81 mg daily pending ECHO results - Risk factor modification - Blood pressure goal - Telemetry monitoring; 30 day event monitor on discharge if no arrythmias captured  - PT consult, OT consult, Speech consult, not indicated as patient is back to baseline - I will follow up the  above results  Lesleigh Noe MD-PhD [facility] [phone]   Addendum:  CTA shows right vert arthero, ends in PICA; left vert is basilarized/dominant; R ICA intracranial segment fairly severe stenosis; L ICA mild atherosclerosis  ECHO with Grade I diastolic dysfunction of the left ventricle, normal biatrial sizes  I favor atheroembolism as the stroke etiology   - 90 days of DAPT indicated per SAMMPRIS trial, ordered ASA 81 mg x 90 day course. If cardiology prefers longer course that is reasonable also.  - Outpatient neurology follow-up - Appreciate cardiology management of troponin elevation. Would recommend  any procedures requiring heparinization be delayed until 1-3 weeks if cardiology feels such delay does not put the patient at undue risk   Addended for charge capture

## 2020-09-22 NOTE — ED Notes (Addendum)
Evidently no transport en route. Agricultural consultant notified. Triage tech pulled to cover. Pt taken to floor by this RN on stretcher.

## 2020-09-22 NOTE — Consult Note (Signed)
[facility] Cardiology  CARDIOLOGY CONSULT NOTE  Patient ID: Latoya Herman MRN: [id number] DOB/AGE: 06-05-1951 69 y.o.  Admit date: 09/21/2020 Referring Physician Billie Ruddy Primary Physician [facility] Primary Cardiologist Nehemiah Massed Reason for Consultation near syncope and elevated troponin  HPI: 69 year old female referred for evaluation of near syncope and elevated troponin.  Patient has a remote history of coronary stent 10/19/2013.  She was in her usual state of health until yesterday at approximately 11 AM when she experienced an acute onset of dizziness and lightheadedness after cooking breakfast without loss of consciousness.  Patient experienced nausea, vomited twice and had diaphoresis without chest pain or shortness of breath.  She presented to [facility] ED where ECG revealed sinus rhythm at 83 bpm.  Admission labs notable for borderline elevated troponin of 87.  Follow-up troponin were 385, 849, and 813 chest pain.  CT scan of the head was negative.  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  CT did not reveal evidence for pulmonary embolus.  Review of systems complete and found to be negative unless listed above     Past Medical History:  Diagnosis Date  . Chronic kidney disease   . Coronary artery disease   . Diabetes mellitus without complication ([place])   . Hyperlipidemia   . Hypertension   . SOBOE (shortness of breath on exertion)     Past Surgical History:  Procedure Laterality Date  . COLONOSCOPY  04/23/2010  . COLONOSCOPY WITH ESOPHAGOGASTRODUODENOSCOPY (EGD)  04/23/2010  . COLONOSCOPY WITH PROPOFOL N/A 08/23/2020   Procedure: COLONOSCOPY WITH PROPOFOL;  Surgeon: Lesly Rubenstein, MD;  Location: [facility] ENDOSCOPY;  Service: Endoscopy;  Laterality: N/A;  . CORONARY ANGIOPLASTY WITH STENT PLACEMENT  10/19/2013  . SUPERFICIAL LYMPH NODE BIOPSY / EXCISION  2013    Medications Prior to Admission  Medication Sig Dispense Refill Last Dose   . atorvastatin (LIPITOR) 80 MG tablet Take 80 mg by mouth daily.   09/20/2020 at 2000  . carvedilol (COREG) 25 MG tablet Take 25 mg by mouth 2 (two) times daily with a meal.   09/20/2020 at 1700  . clopidogrel (PLAVIX) 75 MG tablet Take 75 mg by mouth daily.   09/20/2020 at 0900  . levothyroxine (SYNTHROID) 100 MCG tablet Take 100 mcg by mouth daily before breakfast.   09/20/2020 at 0830  . metFORMIN (GLUCOPHAGE) 1000 MG tablet Take 500 mg by mouth 2 (two) times daily.   09/20/2020 at 1700  . spironolactone (ALDACTONE) 25 MG tablet Take 25 mg by mouth daily.   09/20/2020 at 0900  . fluticasone (FLONASE) 50 MCG/ACT nasal spray Place 2 sprays into both nostrils daily.   unknown at prn  . gabapentin (NEURONTIN) 100 MG capsule Take 100 mg by mouth 3 (three) times daily.    unknown at prn  . OZEMPIC, 0.25 OR 0.5 MG/DOSE, 2 MG/1.5ML SOPN Inject 0.25 mg into the skin once a week.   09/18/2020  . pioglitazone (ACTOS) 30 MG tablet Take 30 mg by mouth daily. (Patient not taking: Reported on 09/21/2020)   Not Taking at Unknown time   Social History   Socioeconomic History  . Marital status: Married    Spouse name: Not on file  . Number of children: Not on file  . Years of education: Not on file  . Highest education level: Not on file  Occupational History  . Not on file  Tobacco Use  . Smoking status: Never Smoker  . Smokeless tobacco: Never Used  Substance and Sexual Activity  . Alcohol use: Never  . Drug use: Never  . Sexual activity: Not on file  Other Topics Concern  . Not on file  Social History Narrative  . Not on file   Social Determinants of Health   Financial Resource Strain:   . Difficulty of Paying Living Expenses: Not on file  Food Insecurity:   . Worried About Charity fundraiser in the Last Year: Not on file  . Ran Out of Food in the Last Year: Not on file  Transportation Needs:   . Lack of Transportation (Medical): Not on file  . Lack of Transportation (Non-Medical): Not on  file  Physical Activity:   . Days of Exercise per Week: Not on file  . Minutes of Exercise per Session: Not on file  Stress:   . Feeling of Stress : Not on file  Social Connections:   . Frequency of Communication with Friends and Family: Not on file  . Frequency of Social Gatherings with Friends and Family: Not on file  . Attends Religious Services: Not on file  . Active Member of Clubs or Organizations: Not on file  . Attends Archivist Meetings: Not on file  . Marital Status: Not on file  Intimate Partner Violence:   . Fear of Current or Ex-Partner: Not on file  . Emotionally Abused: Not on file  . Physically Abused: Not on file  . Sexually Abused: Not on file    Family History  Problem Relation Age of Onset  . Breast cancer Neg Hx       Review of systems complete and found to be negative unless listed above      PHYSICAL EXAM  General: Well developed, well nourished, in no acute distress HEENT:  Normocephalic and atramatic Neck:  No JVD.  Lungs: Clear bilaterally to auscultation and percussion. Heart: HRRR . Normal S1 and S2 without gallops or murmurs.  Abdomen: Bowel sounds are positive, abdomen soft and non-tender  Msk:  Back normal, normal gait. Normal strength and tone for age. Extremities: No clubbing, cyanosis or edema.   Neuro: Alert and oriented X 3. Psych:  Good affect, responds appropriately  Labs:   Lab Results  Component Value Date   WBC 12.2 (H) 09/22/2020   HGB 11.8 (L) 09/22/2020   HCT 35.3 (L) 09/22/2020   MCV 89.1 09/22/2020   PLT 364 09/22/2020    Recent Labs  Lab 09/22/20 0203  NA 138  K 3.2*  CL 100  CO2 26  BUN 16  CREATININE 0.87  CALCIUM 9.4  GLUCOSE 109*   Lab Results  Component Value Date   CKTOTAL 150 10/20/2013   CKMB < 0.5 (L) 10/20/2013   TROPONINI < 0.02 10/20/2013    Lab Results  Component Value Date   CHOL 178 09/22/2020   Lab Results  Component Value Date   HDL 41 09/22/2020   Lab Results   Component Value Date   LDLCALC 95 09/22/2020   Lab Results  Component Value Date   TRIG 208 (H) 09/22/2020   Lab Results  Component Value Date   CHOLHDL 4.3 09/22/2020   No results found for: LDLDIRECT    Radiology: DG Chest 2 View  Result Date: 09/21/2020 CLINICAL DATA:  Dizziness. EXAM: CHEST - 2 VIEW COMPARISON:  Chest radiograph 07/14/2005. FINDINGS: The heart size and mediastinal contours are within normal limits. Both lungs are clear. The visualized skeletal structures are unremarkable. IMPRESSION: No active cardiopulmonary disease.  Electronically Signed   By: Lovey Newcomer M.D.   On: 09/21/2020 14:14   CT Head Wo Contrast  Result Date: 09/21/2020 CLINICAL DATA:  Patient with dizziness. EXAM: CT HEAD WITHOUT CONTRAST TECHNIQUE: Contiguous axial images were obtained from the base of the skull through the vertex without intravenous contrast. COMPARISON:  None. FINDINGS: Brain: Ventricles and sulci are appropriate for patient's age. No evidence for acute cortically based infarct, intracranial hemorrhage, mass lesion or mass-effect. Vascular: Unremarkable Skull: Intact. Sinuses/Orbits: Paranasal sinuses well aerated. Mastoid air cells unremarkable. Orbits unremarkable. Other: None. IMPRESSION: No acute intracranial process. Electronically Signed   By: Lovey Newcomer M.D.   On: 09/21/2020 14:25   CT ANGIO CHEST PE W OR WO CONTRAST  Result Date: 09/21/2020 CLINICAL DATA:  Syncope, dizziness after cooking breakfast, lightheadedness EXAM: CT ANGIOGRAPHY CHEST WITH CONTRAST TECHNIQUE: Multidetector CT imaging of the chest was performed using the standard protocol during bolus administration of intravenous contrast. Multiplanar CT image reconstructions and MIPs were obtained to evaluate the vascular anatomy. CONTRAST:  47mL OMNIPAQUE IOHEXOL 350 MG/ML SOLN COMPARISON:  CT chest 08/26/2006 FINDINGS: Cardiovascular: Satisfactory opacification the pulmonary arteries to the segmental level. No pulmonary  artery filling defects are identified. Central pulmonary arteries are normal caliber. Normal heart size. No pericardial effusion. Three-vessel coronary artery atherosclerosis. Suboptimal opacification of the aorta. Atherosclerotic plaque within the normal caliber aorta. No gross luminal abnormality. No periaortic stranding or hemorrhage. Shared origin of the brachiocephalic and left common carotid arteries. Minimal plaque in the otherwise unremarkable proximal great vessels. Mediastinum/Nodes: No mediastinal fluid or gas. Normal thyroid gland and thoracic inlet. No acute abnormality of the trachea or esophagus. No worrisome mediastinal, hilar or axillary adenopathy. Lungs/Pleura: Dependent atelectatic changes. No consolidation, features of edema, pneumothorax, or effusion. Tiny 3 mm juxtapleural nodule along the right hemidiaphragm (6/50, 7/36), possibly subpleural lymph node. No other concerning nodules or masses. Mild biapical pleuroparenchymal scarring. Upper Abdomen: No acute abnormalities present in the visualized portions of the upper abdomen. Punctate calcification in the liver, may reflect remote sequela of prior granulomatous disease. Musculoskeletal: No acute osseous abnormality or suspicious osseous lesion. Multilevel degenerative changes are present in the imaged portions of the spine. Additional degenerative changes in the shoulders. No worrisome chest wall lesions. Review of the MIP images confirms the above findings. IMPRESSION: 1. No evidence of pulmonary embolism. 2. No acute intrathoracic process. 3. Three-vessel coronary artery atherosclerosis. 4. Aortic Atherosclerosis (ICD10-I70.0). Electronically Signed   By: Lovena Le M.D.   On: 09/21/2020 16:50   MR BRAIN WO CONTRAST  Result Date: 09/21/2020 CLINICAL DATA:  Initial evaluation for acute syncope. EXAM: MRI HEAD WITHOUT CONTRAST TECHNIQUE: Multiplanar, multiecho pulse sequences of the brain and surrounding structures were obtained without  intravenous contrast. COMPARISON:  Prior CT from 09/21/2020. FINDINGS: Brain: Cerebral volume within normal limits for age. No significant cerebral white matter disease. 12 mm remote lacunar infarct noted involving the ventral left paramedian pons (series 16, image 55). Patchy small volume diffusion signal abnormality seen involving the right cerebellum, consistent with acute to early subacute ischemic infarcts (series 5, images 12, 11). The largest of these foci measures 5 mm. No associated hemorrhage or mass effect. No other diffusion abnormality to suggest acute or subacute ischemia. Gray-white matter differentiation otherwise maintained. No other areas of encephalomalacia to suggest chronic cortical infarction. No foci of susceptibility artifact to suggest acute or chronic intracranial hemorrhage. No mass lesion, midline shift or mass effect. No hydrocephalus or extra-axial fluid collection. Pituitary gland and  suprasellar region within normal limits. Midline structures intact. Vascular: Heterogeneous flow void within the right vertebral artery, which could be related to slow flow and/or occlusion (series 10, image 3). Major intracranial vascular flow voids otherwise maintained. Skull and upper cervical spine: Craniocervical junction within normal limits. Bone marrow signal intensity normal. No scalp soft tissue abnormality. Sinuses/Orbits: Globes and orbital soft tissues within normal limits. Scattered mucosal thickening noted throughout the ethmoidal air cells and maxillary sinuses. No mastoid effusion. Inner ear structures grossly normal. Other: None. IMPRESSION: 1. Patchy small volume acute to early subacute ischemic infarcts involving the right cerebellum. No associated hemorrhage or mass effect. 2. 12 mm remote lacunar infarct involving the ventral left paramedian pons. 3. Heterogeneous flow void within the right vertebral artery, which could be related to slow flow and/or occlusion. 4. Otherwise normal  brain MRI for age. Electronically Signed   By: Jeannine Boga M.D.   On: 09/21/2020 22:21   US Venous Img Lower Bilateral (DVT)  Result Date: 09/21/2020 CLINICAL DATA:  Pain, positive D-dimer EXAM: BILATERAL LOWER EXTREMITY VENOUS DOPPLER ULTRASOUND TECHNIQUE: Gray-scale sonography with compression, as well as color and duplex ultrasound, were performed to evaluate the deep venous system(s) from the level of the common femoral vein through the popliteal and proximal calf veins. COMPARISON:  None. FINDINGS: VENOUS Normal compressibility of the common femoral, superficial femoral, and popliteal veins, as well as the visualized calf veins. Visualized portions of profunda femoral vein and great saphenous vein unremarkable. No filling defects to suggest DVT on grayscale or color Doppler imaging. Doppler waveforms show normal direction of venous flow, normal respiratory plasticity and response to augmentation. Limited views of the contralateral common femoral vein are unremarkable. OTHER None. Limitations: none IMPRESSION: No acute DVT. Electronically Signed   By: Valentino Saxon MD   On: 09/21/2020 17:17    EKG: Sinus rhythm without acute ischemic ST-T wave changes  ASSESSMENT AND PLAN:   1.  Near syncope, with dizziness of unknown etiology.  MRI of the brain reveals right sella barrel acute or subacute infarcts. 2.  Elevated troponin (87, 385, 681, 275) of uncertain clinical significance, and the absence of ECG changes or chest pain 3.  Coronary artery disease, history of coronary stent 10/19/2013 4.  Cerebellar infarcts  Recommendations  1.  Agree with current therapy 2.  Continue heparin for now 3.  Review 2D echocardiogram 4.  Await neurology input 5.  Further recommendations pending 2D echocardiogram results and neurology consult  Signed: Isaias Cowman MD,PhD, [facility] 09/22/2020, 7:49 AM

## 2020-09-22 NOTE — Progress Notes (Signed)
OT Cancellation Note  Patient Details Name: Latoya Herman MRN: [id number] DOB: 1951/03/29   Cancelled Treatment:    Reason Eval/Treat Not Completed: OT screened, no needs identified, will sign off. Order received, chart reviewed. Per conversation c pt and PT notes, pt is back to baseline functional independence. No skilled OT needs identified, all questions answered. Will sign off. Please re-consult if additional needs arise.   Dessie Coma, M.S. OTR/L  09/22/20, 3:10 PM  ascom [phone]

## 2020-09-22 NOTE — Progress Notes (Signed)
*  PRELIMINARY RESULTS* Echocardiogram 2D Echocardiogram has been performed.  Latoya Herman 09/22/2020, 1:35 PM

## 2020-09-22 NOTE — Progress Notes (Signed)
PROGRESS NOTE    Latoya Herman  MIW:[id number] DOB: 1951-06-19 DOA: 09/21/2020 PCP: Kirk Ruths, MD    Assessment & Plan:   Principal Problem:   Near syncope Active Problems:   Coronary artery disease   Hypertension   Hyperlipidemia   Diabetes mellitus without complication (HCC)   Elevated troponin   Leukocytosis   Hypothyroidism   Acute ischemic stroke (HCC)   Latoya Herman is a 69 y.o. 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   --Today, symptoms resolved, and pt is back to baseline. PLAN: --Neuro consult today --Stroke labs TSH, ESR, RPR, HgbA1c, fasting lipid panel -LDL 90 despite maximum dose of atorvastatin, consider referral to lipid clinic for PSK 9 inhibitor - CTA head and neck - Echocardiogram pending - Stop heparin drip, no therapeutic AC for the next 5-10 days unless there is a life-threatening indication (and in this case please use a low-goal no-bolus protocol) - Continue Plavix 75 mg daily,  - Patient has received ASA 325 mg at 15:42, continue 81 mg daily pending ECHO results - Telemetry monitoring; 30 day event monitor on discharge if no arrythmias captured   Elevated troponin likely due to demand ischemia Coronary artery disease s/p stent 10/19/2013 Trop 87 --> 385-->800's peaked, patient does not have CP or SOB.  No ACS-related EKG changes. --Heparin gtt started --cardiology consulted PLAN: --d/c heparin gtt due to acute stroke --Echo  Hypertension:  bp elevated 192/83 -IV hydralazine. --cont home coreg and spironolactone  Hyperlipidemia -Lipitor  Diabetes mellitus without complication [facility]): Recent A1c 5.9, well controlled.  Patient is taking Metformin and Ozempic at home --Hold home agents while inpatient --no need for  fingersticks  Leukocytosis: WBC 12.6. No fever.  No source of infection identified, likely reactive. -Follow-up with CBC  Hypothyroidism: -Continue home Synthroid  Hypokalemia --replete PRN   DVT prophylaxis: Lovenox SQ Code Status: Full code  Family Communication: daughter updated at bedside today Status is: change to inpatient Dispo:   The patient is from: home Anticipated d/c is to: home Anticipated d/c date is: tomorrow Patient currently is not medically stable to d/c due to: pending completion of stroke workup and cardiac workup   Subjective and Interval History:  Pt reported dizziness resolved.  Felt well.  No chest pain.   Objective: Vitals:   09/22/20 0747 09/22/20 1110 09/22/20 1513 09/22/20 1931  BP: (!) 157/75 (!) 141/75 113/66 (!) 151/97  Pulse: 82 85 77 82  Resp: 18 17 18    Temp: 98.2 F (36.8 C) 97.6 F (36.4 C) 98 F (36.7 C) 98.4 F (36.9 C)  TempSrc: Oral Oral Oral Oral  SpO2: 98% 97% 96% 98%  Weight:      Height:        Intake/Output Summary (Last 24 hours) at 09/22/2020 1933 Last data filed at 09/22/2020 1850 Gross per 24 hour  Intake 600 ml  Output 0 ml  Net 600 ml   Filed Weights   09/21/20 1252 09/22/20 0500  Weight: 65.8 kg 62.2 kg    Examination:   Constitutional: NAD, AAOx3 HEENT: conjunctivae and lids normal, EOMI CV: RRR no M,R,G. Distal pulses +2.  No cyanosis.   RESP: CTA B/L, normal respiratory effort  GI: +BS, NTND Extremities: No effusions, edema, or tenderness in BLE SKIN: warm, dry and intact Neuro: II - XII grossly intact.  Sensation intact Psych: Normal mood and affect.  Appropriate judgement and reason   Data Reviewed: I have personally reviewed following labs and imaging studies  CBC: Recent Labs  Lab 09/21/20 1256 09/22/20 0203  WBC 12.6* 12.2*  HGB 11.7* 11.8*  HCT 34.5* 35.3*  MCV 89.1 89.1  PLT 358 638   Basic Metabolic Panel: Recent Labs  Lab 09/21/20 1256 09/22/20 0203  NA 133* 138    K 4.0 3.2*  CL 99 100  CO2 23 26  GLUCOSE 203* 109*  BUN 16 16  CREATININE 0.93 0.87  CALCIUM 9.3 9.4   GFR: Estimated Creatinine Clearance: 53.4 mL/min (by C-G formula based on SCr of 0.87 mg/dL). Liver Function Tests: No results for input(s): AST, ALT, ALKPHOS, BILITOT, PROT, ALBUMIN in the last 168 hours. No results for input(s): LIPASE, AMYLASE in the last 168 hours. No results for input(s): AMMONIA in the last 168 hours. Coagulation Profile: Recent Labs  Lab 09/22/20 0203  INR 0.9   Cardiac Enzymes: No results for input(s): CKTOTAL, CKMB, CKMBINDEX, TROPONINI in the last 168 hours. BNP (last 3 results) No results for input(s): PROBNP in the last 8760 hours. HbA1C: Recent Labs    09/21/20 1256  HGBA1C 6.5*   CBG: Recent Labs  Lab 09/21/20 1938 09/21/20 2203 09/22/20 0407 09/22/20 0746  GLUCAP 101* 101* 95 116*   Lipid Profile: Recent Labs    09/22/20 0203  CHOL 178  HDL 41  LDLCALC 95  TRIG 208*  CHOLHDL 4.3   Thyroid Function Tests: No results for input(s): TSH, T4TOTAL, FREET4, T3FREE, THYROIDAB in the last 72 hours. Anemia Panel: No results for input(s): VITAMINB12, FOLATE, FERRITIN, TIBC, IRON, RETICCTPCT in the last 72 hours. Sepsis Labs: No results for input(s): PROCALCITON, LATICACIDVEN in the last 168 hours.  Recent Results (from the past 240 hour(s))  Respiratory Panel by RT PCR (Flu A&B, Covid) - Nasopharyngeal Swab     Status: None   Collection Time: 09/21/20  3:22 PM   Specimen: Nasopharyngeal Swab  Result Value Ref Range Status   SARS Coronavirus 2 by RT PCR NEGATIVE NEGATIVE Final    Comment: (NOTE) SARS-CoV-2 target nucleic acids are NOT DETECTED.  The SARS-CoV-2 RNA is generally detectable in upper respiratoy specimens during the acute phase of infection. The lowest concentration of SARS-CoV-2 viral copies this assay can detect is 131 copies/mL. A negative result does not preclude SARS-Cov-2 infection and should not be used as  the sole basis for treatment or other patient management decisions. A negative result may occur with  improper specimen collection/handling, submission of specimen other than nasopharyngeal swab, presence of viral mutation(s) within the areas targeted by this assay, and inadequate number of viral copies (<131 copies/mL). A negative result must be combined with clinical observations, patient history, and epidemiological information. The expected result is Negative.  Fact Sheet for Patients:  PinkCheek.be  Fact Sheet for Healthcare Providers:  GravelBags.it  This test is no t yet approved or cleared by the [place] FDA and  has been authorized for detection and/or diagnosis of SARS-CoV-2 by FDA under an Emergency Use Authorization (EUA). This EUA will remain  in effect (meaning this test can be used) for the duration of the COVID-19 declaration under Section 564(b)(1) of the Act, 21 U.S.C. section 360bbb-3(b)(1), unless the authorization is terminated or revoked sooner.     Influenza A by PCR NEGATIVE NEGATIVE Final   Influenza B by PCR NEGATIVE NEGATIVE Final    Comment: (NOTE) The Xpert  Xpress SARS-CoV-2/FLU/RSV assay is intended as an aid in  the diagnosis of influenza from Nasopharyngeal swab specimens and  should not be used as a sole basis for treatment. Nasal washings and  aspirates are unacceptable for Xpert Xpress SARS-CoV-2/FLU/RSV  testing.  Fact Sheet for Patients: PinkCheek.be  Fact Sheet for Healthcare Providers: GravelBags.it  This test is not yet approved or cleared by the [place] FDA and  has been authorized for detection and/or diagnosis of SARS-CoV-2 by  FDA under an Emergency Use Authorization (EUA). This EUA will remain  in effect (meaning this test can be used) for the duration of the  Covid-19 declaration under Section 564(b)(1)  of the Act, 21  U.S.C. section 360bbb-3(b)(1), unless the authorization is  terminated or revoked. Performed at [facility], [address], [place], [place] [address]       Radiology Studies: DG Chest 2 View  Result Date: 09/21/2020 CLINICAL DATA:  Dizziness. EXAM: CHEST - 2 VIEW COMPARISON:  Chest radiograph 07/14/2005. FINDINGS: The heart size and mediastinal contours are within normal limits. Both lungs are clear. The visualized skeletal structures are unremarkable. IMPRESSION: No active cardiopulmonary disease. Electronically Signed   By: Lovey Newcomer M.D.   On: 09/21/2020 14:14   CT Head Wo Contrast  Result Date: 09/21/2020 CLINICAL DATA:  Patient with dizziness. EXAM: CT HEAD WITHOUT CONTRAST TECHNIQUE: Contiguous axial images were obtained from the base of the skull through the vertex without intravenous contrast. COMPARISON:  None. FINDINGS: Brain: Ventricles and sulci are appropriate for patient's age. No evidence for acute cortically based infarct, intracranial hemorrhage, mass lesion or mass-effect. Vascular: Unremarkable Skull: Intact. Sinuses/Orbits: Paranasal sinuses well aerated. Mastoid air cells unremarkable. Orbits unremarkable. Other: None. IMPRESSION: No acute intracranial process. Electronically Signed   By: Lovey Newcomer M.D.   On: 09/21/2020 14:25   CT ANGIO CHEST PE W OR WO CONTRAST  Result Date: 09/21/2020 CLINICAL DATA:  Syncope, dizziness after cooking breakfast, lightheadedness EXAM: CT ANGIOGRAPHY CHEST WITH CONTRAST TECHNIQUE: Multidetector CT imaging of the chest was performed using the standard protocol during bolus administration of intravenous contrast. Multiplanar CT image reconstructions and MIPs were obtained to evaluate the vascular anatomy. CONTRAST:  56m OMNIPAQUE IOHEXOL 350 MG/ML SOLN COMPARISON:  CT chest 08/26/2006 FINDINGS: Cardiovascular: Satisfactory opacification the pulmonary arteries to the segmental level. No pulmonary artery filling  defects are identified. Central pulmonary arteries are normal caliber. Normal heart size. No pericardial effusion. Three-vessel coronary artery atherosclerosis. Suboptimal opacification of the aorta. Atherosclerotic plaque within the normal caliber aorta. No gross luminal abnormality. No periaortic stranding or hemorrhage. Shared origin of the brachiocephalic and left common carotid arteries. Minimal plaque in the otherwise unremarkable proximal great vessels. Mediastinum/Nodes: No mediastinal fluid or gas. Normal thyroid gland and thoracic inlet. No acute abnormality of the trachea or esophagus. No worrisome mediastinal, hilar or axillary adenopathy. Lungs/Pleura: Dependent atelectatic changes. No consolidation, features of edema, pneumothorax, or effusion. Tiny 3 mm juxtapleural nodule along the right hemidiaphragm (6/50, 7/36), possibly subpleural lymph node. No other concerning nodules or masses. Mild biapical pleuroparenchymal scarring. Upper Abdomen: No acute abnormalities present in the visualized portions of the upper abdomen. Punctate calcification in the liver, may reflect remote sequela of prior granulomatous disease. Musculoskeletal: No acute osseous abnormality or suspicious osseous lesion. Multilevel degenerative changes are present in the imaged portions of the spine. Additional degenerative changes in the shoulders. No worrisome chest wall lesions. Review of the MIP images confirms the above findings. IMPRESSION: 1. No evidence of  pulmonary embolism. 2. No acute intrathoracic process. 3. Three-vessel coronary artery atherosclerosis. 4. Aortic Atherosclerosis (ICD10-I70.0). Electronically Signed   By: Lovena Le M.D.   On: 09/21/2020 16:50   MR BRAIN WO CONTRAST  Result Date: 09/21/2020 CLINICAL DATA:  Initial evaluation for acute syncope. EXAM: MRI HEAD WITHOUT CONTRAST TECHNIQUE: Multiplanar, multiecho pulse sequences of the brain and surrounding structures were obtained without intravenous  contrast. COMPARISON:  Prior CT from 09/21/2020. FINDINGS: Brain: Cerebral volume within normal limits for age. No significant cerebral white matter disease. 12 mm remote lacunar infarct noted involving the ventral left paramedian pons (series 16, image 55). Patchy small volume diffusion signal abnormality seen involving the right cerebellum, consistent with acute to early subacute ischemic infarcts (series 5, images 12, 11). The largest of these foci measures 5 mm. No associated hemorrhage or mass effect. No other diffusion abnormality to suggest acute or subacute ischemia. Gray-white matter differentiation otherwise maintained. No other areas of encephalomalacia to suggest chronic cortical infarction. No foci of susceptibility artifact to suggest acute or chronic intracranial hemorrhage. No mass lesion, midline shift or mass effect. No hydrocephalus or extra-axial fluid collection. Pituitary gland and suprasellar region within normal limits. Midline structures intact. Vascular: Heterogeneous flow void within the right vertebral artery, which could be related to slow flow and/or occlusion (series 10, image 3). Major intracranial vascular flow voids otherwise maintained. Skull and upper cervical spine: Craniocervical junction within normal limits. Bone marrow signal intensity normal. No scalp soft tissue abnormality. Sinuses/Orbits: Globes and orbital soft tissues within normal limits. Scattered mucosal thickening noted throughout the ethmoidal air cells and maxillary sinuses. No mastoid effusion. Inner ear structures grossly normal. Other: None. IMPRESSION: 1. Patchy small volume acute to early subacute ischemic infarcts involving the right cerebellum. No associated hemorrhage or mass effect. 2. 12 mm remote lacunar infarct involving the ventral left paramedian pons. 3. Heterogeneous flow void within the right vertebral artery, which could be related to slow flow and/or occlusion. 4. Otherwise normal brain MRI for  age. Electronically Signed   By: Jeannine Boga M.D.   On: 09/21/2020 22:21   US Venous Img Lower Bilateral (DVT)  Result Date: 09/21/2020 CLINICAL DATA:  Pain, positive D-dimer EXAM: BILATERAL LOWER EXTREMITY VENOUS DOPPLER ULTRASOUND TECHNIQUE: Gray-scale sonography with compression, as well as color and duplex ultrasound, were performed to evaluate the deep venous system(s) from the level of the common femoral vein through the popliteal and proximal calf veins. COMPARISON:  None. FINDINGS: VENOUS Normal compressibility of the common femoral, superficial femoral, and popliteal veins, as well as the visualized calf veins. Visualized portions of profunda femoral vein and great saphenous vein unremarkable. No filling defects to suggest DVT on grayscale or color Doppler imaging. Doppler waveforms show normal direction of venous flow, normal respiratory plasticity and response to augmentation. Limited views of the contralateral common femoral vein are unremarkable. OTHER None. Limitations: none IMPRESSION: No acute DVT. Electronically Signed   By: Valentino Saxon MD   On: 09/21/2020 17:17   ECHOCARDIOGRAM COMPLETE  Result Date: 09/22/2020    ECHOCARDIOGRAM REPORT   Patient Name:   KAYLOR MAIERS Date of Exam: 09/22/2020 Medical Rec #:  [id number]       Height:       65.0 in Accession #:    [id number]      Weight:       137.1 lb Date of Birth:  1951/07/16        BSA:  1.685 m Patient Age:    63 years        BP:           141/75 mmHg Patient Gender: F               HR:           79 bpm. Exam Location:  [facility] Procedure: 2D Echo, Color Doppler and Cardiac Doppler Indications:     R55 Syncope  History:         Patient has no prior history of Echocardiogram examinations.                  CAD, CKD; Risk Factors:Hypertension, Diabetes and Dyslipidemia.  Sonographer:     Charmayne Sheer RDCS (AE) Referring Phys:  [id number] Lorenza Chick Diagnosing Phys: Isaias Cowman MD IMPRESSIONS  1. Left ventricular  ejection fraction, by estimation, is 55 to 60%. The left ventricle has normal function. The left ventricle has no regional wall motion abnormalities. Left ventricular diastolic parameters are consistent with Grade I diastolic dysfunction (impaired relaxation).  2. Right ventricular systolic function is normal. The right ventricular size is normal.  3. The mitral valve is normal in structure. Trivial mitral valve regurgitation. No evidence of mitral stenosis.  4. The aortic valve is normal in structure. Aortic valve regurgitation is mild. No aortic stenosis is present.  5. The inferior vena cava is normal in size with greater than 50% respiratory variability, suggesting right atrial pressure of 3 mmHg. FINDINGS  Left Ventricle: Left ventricular ejection fraction, by estimation, is 55 to 60%. The left ventricle has normal function. The left ventricle has no regional wall motion abnormalities. The left ventricular internal cavity size was normal in size. There is  no left ventricular hypertrophy. Left ventricular diastolic parameters are consistent with Grade I diastolic dysfunction (impaired relaxation). Right Ventricle: The right ventricular size is normal. No increase in right ventricular wall thickness. Right ventricular systolic function is normal. Left Atrium: Left atrial size was normal in size. Right Atrium: Right atrial size was normal in size. Pericardium: There is no evidence of pericardial effusion. Mitral Valve: The mitral valve is normal in structure. Trivial mitral valve regurgitation. No evidence of mitral valve stenosis. MV peak gradient, 4.2 mmHg. The mean mitral valve gradient is 1.0 mmHg. Tricuspid Valve: The tricuspid valve is normal in structure. Tricuspid valve regurgitation is trivial. No evidence of tricuspid stenosis. Aortic Valve: The aortic valve is normal in structure. Aortic valve regurgitation is mild. No aortic stenosis is present. Aortic valve mean gradient measures 4.0 mmHg. Aortic  valve peak gradient measures 7.2 mmHg. Aortic valve area, by VTI measures 1.47 cm. Pulmonic Valve: The pulmonic valve was normal in structure. Pulmonic valve regurgitation is not visualized. No evidence of pulmonic stenosis. Aorta: The aortic root is normal in size and structure. Venous: The inferior vena cava is normal in size with greater than 50% respiratory variability, suggesting right atrial pressure of 3 mmHg. IAS/Shunts: No atrial level shunt detected by color flow Doppler.  LEFT VENTRICLE PLAX 2D LVIDd:         4.00 cm  Diastology LVIDs:         2.67 cm  LV e' medial:    4.46 cm/s LV PW:         0.87 cm  LV E/e' medial:  15.7 LV IVS:        0.93 cm  LV e' lateral:   4.57 cm/s LVOT diam:  1.60 cm  LV E/e' lateral: 15.3 LV SV:         37 LV SV Index:   22 LVOT Area:     2.01 cm  RIGHT VENTRICLE RV Basal diam:  2.50 cm LEFT ATRIUM             Index       RIGHT ATRIUM          Index LA diam:        3.60 cm 2.14 cm/m  RA Area:     9.70 cm LA Vol (A2C):   20.4 ml 12.11 ml/m RA Volume:   19.70 ml 11.69 ml/m LA Vol (A4C):   33.8 ml 20.06 ml/m LA Biplane Vol: 28.2 ml 16.74 ml/m  AORTIC VALVE                   PULMONIC VALVE AV Area (Vmax):    1.56 cm    PV Vmax:       1.03 m/s AV Area (Vmean):   1.43 cm    PV Vmean:      73.600 cm/s AV Area (VTI):     1.47 cm    PV VTI:        0.219 m AV Vmax:           134.00 cm/s PV Peak grad:  4.2 mmHg AV Vmean:          97.900 cm/s PV Mean grad:  2.0 mmHg AV VTI:            0.250 m AV Peak Grad:      7.2 mmHg AV Mean Grad:      4.0 mmHg LVOT Vmax:         104.00 cm/s LVOT Vmean:        69.500 cm/s LVOT VTI:          0.183 m LVOT/AV VTI ratio: 0.73  AORTA Ao Root diam: 2.80 cm MITRAL VALVE MV Area (PHT): 3.95 cm    SHUNTS MV Peak grad:  4.2 mmHg    Systemic VTI:  0.18 m MV Mean grad:  1.0 mmHg    Systemic Diam: 1.60 cm MV Vmax:       1.03 m/s MV Vmean:      52.9 cm/s MV Decel Time: 192 msec MV E velocity: 69.80 cm/s MV A velocity: 93.40 cm/s MV E/A ratio:  0.75  Isaias Cowman MD Electronically signed by Isaias Cowman MD Signature Date/Time: 09/22/2020/2:52:59 PM    Final      Scheduled Meds: . atorvastatin  80 mg Oral Daily  . carvedilol  25 mg Oral BID WC  . clopidogrel  75 mg Oral Daily  . levothyroxine  100 mcg Oral Q0600  . sodium chloride flush  3 mL Intravenous Q12H  . spironolactone  25 mg Oral Daily   Continuous Infusions:   LOS: 0 days     Enzo Bi, MD [facility] If 7PM-7AM, please contact night-coverage 09/22/2020, 7:33 PM

## 2020-09-23 DIAGNOSIS — R55 Syncope and collapse: Secondary | ICD-10-CM | POA: Diagnosis not present

## 2020-09-23 DIAGNOSIS — I639 Cerebral infarction, unspecified: Secondary | ICD-10-CM

## 2020-09-23 LAB — CBC
HCT: 30.2 % — ABNORMAL LOW (ref 36.0–46.0)
Hemoglobin: 10.3 g/dL — ABNORMAL LOW (ref 12.0–15.0)
MCH: 30.5 pg (ref 26.0–34.0)
MCHC: 34.1 g/dL (ref 30.0–36.0)
MCV: 89.3 fL (ref 80.0–100.0)
Platelets: 306 10*3/uL (ref 150–400)
RBC: 3.38 MIL/uL — ABNORMAL LOW (ref 3.87–5.11)
RDW: 12.9 % (ref 11.5–15.5)
WBC: 9.1 10*3/uL (ref 4.0–10.5)
nRBC: 0 % (ref 0.0–0.2)

## 2020-09-23 LAB — BASIC METABOLIC PANEL
Anion gap: 7 (ref 5–15)
BUN: 25 mg/dL — ABNORMAL HIGH (ref 8–23)
CO2: 26 mmol/L (ref 22–32)
Calcium: 8.9 mg/dL (ref 8.9–10.3)
Chloride: 102 mmol/L (ref 98–111)
Creatinine, Ser: 1.07 mg/dL — ABNORMAL HIGH (ref 0.44–1.00)
GFR, Estimated: 52 mL/min — ABNORMAL LOW (ref >60)
Glucose, Bld: 112 mg/dL — ABNORMAL HIGH (ref 70–99)
Potassium: 3.7 mmol/L (ref 3.5–5.1)
Sodium: 135 mmol/L (ref 135–145)

## 2020-09-23 LAB — MAGNESIUM: Magnesium: 1.7 mg/dL (ref 1.7–2.4)

## 2020-09-23 MED ORDER — ASPIRIN 81 MG PO CHEW: 81.0000 mg | CHEWABLE_TABLET | Freq: Every day | ORAL | 0 refills | Status: DC

## 2020-09-23 MED ORDER — MAGNESIUM SULFATE 2 GM/50ML IV SOLN
2.0000 g | Freq: Once | INTRAVENOUS | Status: AC
  Administered 2020-09-23: 2 g via INTRAVENOUS
  Filled 2020-09-23: qty 50

## 2020-09-23 NOTE — Progress Notes (Signed)
Subjective: Patient awake and alert.  No new complaints.  Reports that she is no longer dizzy.    Objective: Current vital signs: BP 127/87 (BP Location: Right Arm)   Pulse 70   Temp (!) 97.5 F (36.4 C) (Oral)   Resp 16   Ht 5\' 5"  (1.651 m)   Wt 62 kg   SpO2 98%   BMI 22.75 kg/m  Vital signs in last 24 hours: Temp:  [97.5 F (36.4 C)-98.8 F (37.1 C)] 97.5 F (36.4 C) (10/11 1132) Pulse Rate:  [70-83] 70 (10/11 1132) Resp:  [16-18] 16 (10/11 1132) BP: (113-160)/(60-97) 127/87 (10/11 1132) SpO2:  [95 %-98 %] 98 % (10/11 1132) Weight:  [62 kg] 62 kg (10/11 0425)  Intake/Output from previous day: 10/10 0701 - 10/11 0700 In: 600 [P.O.:600] Out: 300 [Urine:300] Intake/Output this shift: Total I/O In: 0  Out: 700 [Urine:700] Nutritional status:  Diet Order            Diet heart healthy/carb modified Room service appropriate? Yes; Fluid consistency: Thin  Diet effective now                 Neurologic Exam: Mental Status: Alert, oriented, thought content appropriate.  Speech fluent without evidence of aphasia.  Able to follow 3 step commands without difficulty. Cranial Nerves: II: Discs flat bilaterally; Visual fields grossly normal, pupils equal, round, reactive to light and accommodation III,IV, VI: ptosis not present, extra-ocular motions intact bilaterally V,VII: smile symmetric, facial light touch sensation normal bilaterally VIII: hearing normal bilaterally IX,X: gag reflex present XI: bilateral shoulder shrug XII: midline tongue extension Motor: 5/5 throughout; no atrophy noted Sensory: Pinprick and light touch intact throughout, bilaterally   Lab Results: Basic Metabolic Panel: Recent Labs  Lab 09/21/20 1256 09/22/20 0203 09/23/20 0452  NA 133* 138 135  K 4.0 3.2* 3.7  CL 99 100 102  CO2 23 26 26   GLUCOSE 203* 109* 112*  BUN 16 16 25*  CREATININE 0.93 0.87 1.07*  CALCIUM 9.3 9.4 8.9  MG  --   --  1.7    Liver Function Tests: No results  for input(s): AST, ALT, ALKPHOS, BILITOT, PROT, ALBUMIN in the last 168 hours. No results for input(s): LIPASE, AMYLASE in the last 168 hours. No results for input(s): AMMONIA in the last 168 hours.  CBC: Recent Labs  Lab 09/21/20 1256 09/22/20 0203 09/23/20 0452  WBC 12.6* 12.2* 9.1  HGB 11.7* 11.8* 10.3*  HCT 34.5* 35.3* 30.2*  MCV 89.1 89.1 89.3  PLT 358 364 306    Cardiac Enzymes: No results for input(s): CKTOTAL, CKMB, CKMBINDEX, TROPONINI in the last 168 hours.  Lipid Panel: Recent Labs  Lab 09/22/20 0203  CHOL 178  TRIG 208*  HDL 41  CHOLHDL 4.3  VLDL 42*  LDLCALC 95    CBG: Recent Labs  Lab 09/21/20 1938 09/21/20 2203 09/22/20 0407 09/22/20 0746  GLUCAP 101* 101* 95 116*    Microbiology: Results for orders placed or performed during the hospital encounter of 09/21/20  Respiratory Panel by RT PCR (Flu A&B, Covid) - Nasopharyngeal Swab     Status: None   Collection Time: 09/21/20  3:22 PM   Specimen: Nasopharyngeal Swab  Result Value Ref Range Status   SARS Coronavirus 2 by RT PCR NEGATIVE NEGATIVE Final    Comment: (NOTE) SARS-CoV-2 target nucleic acids are NOT DETECTED.  The SARS-CoV-2 RNA is generally detectable in upper respiratoy specimens during the acute phase of infection. The lowest concentration of  SARS-CoV-2 viral copies this assay can detect is 131 copies/mL. A negative result does not preclude SARS-Cov-2 infection and should not be used as the sole basis for treatment or other patient management decisions. A negative result may occur with  improper specimen collection/handling, submission of specimen other than nasopharyngeal swab, presence of viral mutation(s) within the areas targeted by this assay, and inadequate number of viral copies (<131 copies/mL). A negative result must be combined with clinical observations, patient history, and epidemiological information. The expected result is Negative.  Fact Sheet for Patients:   PinkCheek.be  Fact Sheet for Healthcare Providers:  GravelBags.it  This test is no t yet approved or cleared by the [place] FDA and  has been authorized for detection and/or diagnosis of SARS-CoV-2 by FDA under an Emergency Use Authorization (EUA). This EUA will remain  in effect (meaning this test can be used) for the duration of the COVID-19 declaration under Section 564(b)(1) of the Act, 21 U.S.C. section 360bbb-3(b)(1), unless the authorization is terminated or revoked sooner.     Influenza A by PCR NEGATIVE NEGATIVE Final   Influenza B by PCR NEGATIVE NEGATIVE Final    Comment: (NOTE) The Xpert Xpress SARS-CoV-2/FLU/RSV assay is intended as an aid in  the diagnosis of influenza from Nasopharyngeal swab specimens and  should not be used as a sole basis for treatment. Nasal washings and  aspirates are unacceptable for Xpert Xpress SARS-CoV-2/FLU/RSV  testing.  Fact Sheet for Patients: PinkCheek.be  Fact Sheet for Healthcare Providers: GravelBags.it  This test is not yet approved or cleared by the [place] FDA and  has been authorized for detection and/or diagnosis of SARS-CoV-2 by  FDA under an Emergency Use Authorization (EUA). This EUA will remain  in effect (meaning this test can be used) for the duration of the  Covid-19 declaration under Section 564(b)(1) of the Act, 21  U.S.C. section 360bbb-3(b)(1), unless the authorization is  terminated or revoked. Performed at [facility], [address] [place], [place] [address]     Coagulation Studies: Recent Labs    09/22/20 0203  LABPROT 12.0  INR 0.9    Imaging: CT Code Stroke CTA Head W/WO contrast  Result Date: 09/22/2020 CLINICAL DATA:  Stroke/TIA. Sudden onset of dizziness and lightheadedness yesterday. Symptoms lasted for 1 hour. Abnormal MRI. 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 EXAM: CT ANGIOGRAPHY HEAD AND NECK TECHNIQUE: Multidetector CT imaging of the head and neck was performed using the standard protocol during bolus administration of intravenous contrast. Multiplanar CT image reconstructions and MIPs were obtained to evaluate the vascular anatomy. Carotid stenosis measurements (when applicable) are obtained utilizing NASCET criteria, using the distal internal carotid diameter as the denominator. CONTRAST:  29mL OMNIPAQUE IOHEXOL 350 MG/ML SOLN COMPARISON:  MR head without contrast 09/21/2020. FINDINGS: Brain: Right cerebellar infarcts are less well seen than on the MRI. No new infarcts are present. No acute hemorrhage or mass lesion is present. The ventricles are of normal size. No significant extraaxial fluid collection is present. Vascular: Extensive atherosclerotic calcifications are present within the cavernous internal carotid arteries bilaterally. No hyperdense vessel is present. Skull: Calvarium is intact. No focal lytic or blastic lesions are present. No significant extracranial soft tissue lesion is present. Sinuses: The paranasal sinuses and mastoid air cells are clear. Orbits: The globes and orbits are within normal limits. CTA NECK FINDINGS Aortic arch: Atherosclerotic calcifications are present within the distal arch including the origin of the left subclavian artery. No significant  stenosis is present. Common origin of the left common carotid artery and innominate artery is noted. Right carotid system: Right common carotid artery is within normal limits. The proximal right ICA is narrowed to 1 mm. More distal cervical right ICA is within normal limits. Left carotid system: Left common carotid artery demonstrates some mural calcification without significant stenosis. Atherosclerotic changes are present at the bifurcation. No significant stenosis is present relative to the more distal vessel. Atherosclerotic calcifications are present  along the wall of distal right ICA, just below the skull base. Vertebral arteries: The left vertebral artery is dominant vessel. Both vertebral arteries originate from the subclavian arteries. Segmental narrowing is present in the distal right P2 segment. High-grade stenosis is present in the right vertebral artery at the dural margin. Skeleton: Anterior fusion is present at C6-7. Adjacent level degenerative changes are present C5-6 and to lesser extent at C4-5. Slight anterolisthesis present at C4-5. Straightening of the normal cervical lordosis is present. Other neck: The soft tissues the neck are otherwise unremarkable. Upper chest: Lung apices are clear. Review of the MIP images confirms the above findings CTA HEAD FINDINGS Anterior circulation: Atherosclerotic calcifications are present within the cavernous internal carotid arteries bilaterally. Mild narrowing of less than 50% is present bilaterally. ICA termini within normal limits. The A1 and M1 segments are normal. The anterior communicating artery is patent. MCA bifurcations are within normal limits. ACA and MCA branch vessels are within normal limits. Posterior circulation: Left vertebral artery is dominant vessel. Left PICA origin is visualized and normal. High-grade stenosis is present at the dural margin of the right vertebral artery. The right V4 segment is occluded beyond the PICA. Basilar artery is normal. Both posterior cerebral arteries originate from the basilar tip. PCA branch vessels are within normal limits. Venous sinuses: Dural sinuses are patent. The straight sinus deep cerebral veins are intact. Cortical veins are within normal limits. Anatomic variants: None Review of the MIP images confirms the above findings IMPRESSION: 1. 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 2. High-grade, near occlusive stenosis of the proximal right internal carotid artery measuring up to 1 mm. 3. High-grade stenosis of the right vertebral artery at the  dural margin. 4. Atherosclerotic changes at the left carotid bifurcation and cavernous internal carotid arteries bilaterally without significant stenosis relative to the more distal vessels. 5. No other significant proximal stenosis, aneurysm, or branch vessel occlusion within the Circle of Willis. 6. Multilevel spondylosis of the cervical spine. 7. Aortic Atherosclerosis (ICD10-I70.0). Electronically Signed   By: San Morelle M.D.   On: 09/22/2020 16:38   DG Chest 2 View  Result Date: 09/21/2020 CLINICAL DATA:  Dizziness. EXAM: CHEST - 2 VIEW COMPARISON:  Chest radiograph 07/14/2005. FINDINGS: The heart size and mediastinal contours are within normal limits. Both lungs are clear. The visualized skeletal structures are unremarkable. IMPRESSION: No active cardiopulmonary disease. Electronically Signed   By: Lovey Newcomer M.D.   On: 09/21/2020 14:14   CT Head Wo Contrast  Result Date: 09/21/2020 CLINICAL DATA:  Patient with dizziness. EXAM: CT HEAD WITHOUT CONTRAST TECHNIQUE: Contiguous axial images were obtained from the base of the skull through the vertex without intravenous contrast. COMPARISON:  None. FINDINGS: Brain: Ventricles and sulci are appropriate for patient's age. No evidence for acute cortically based infarct, intracranial hemorrhage, mass lesion or mass-effect. Vascular: Unremarkable Skull: Intact. Sinuses/Orbits: Paranasal sinuses well aerated. Mastoid air cells unremarkable. Orbits unremarkable. Other: None. IMPRESSION: No acute intracranial process. Electronically Signed   By:  Lovey Newcomer M.D.   On: 09/21/2020 14:25   CT Code Stroke CTA Neck W/WO contrast  Result Date: 09/22/2020 CLINICAL DATA:  Stroke/TIA. Sudden onset of dizziness and lightheadedness yesterday. Symptoms lasted for 1 hour. Abnormal MRI. 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 EXAM: CT ANGIOGRAPHY HEAD AND NECK TECHNIQUE: Multidetector CT imaging of the head and neck was performed using the  standard protocol during bolus administration of intravenous contrast. Multiplanar CT image reconstructions and MIPs were obtained to evaluate the vascular anatomy. Carotid stenosis measurements (when applicable) are obtained utilizing NASCET criteria, using the distal internal carotid diameter as the denominator. CONTRAST:  38mL OMNIPAQUE IOHEXOL 350 MG/ML SOLN COMPARISON:  MR head without contrast 09/21/2020. FINDINGS: Brain: Right cerebellar infarcts are less well seen than on the MRI. No new infarcts are present. No acute hemorrhage or mass lesion is present. The ventricles are of normal size. No significant extraaxial fluid collection is present. Vascular: Extensive atherosclerotic calcifications are present within the cavernous internal carotid arteries bilaterally. No hyperdense vessel is present. Skull: Calvarium is intact. No focal lytic or blastic lesions are present. No significant extracranial soft tissue lesion is present. Sinuses: The paranasal sinuses and mastoid air cells are clear. Orbits: The globes and orbits are within normal limits. CTA NECK FINDINGS Aortic arch: Atherosclerotic calcifications are present within the distal arch including the origin of the left subclavian artery. No significant stenosis is present. Common origin of the left common carotid artery and innominate artery is noted. Right carotid system: Right common carotid artery is within normal limits. The proximal right ICA is narrowed to 1 mm. More distal cervical right ICA is within normal limits. Left carotid system: Left common carotid artery demonstrates some mural calcification without significant stenosis. Atherosclerotic changes are present at the bifurcation. No significant stenosis is present relative to the more distal vessel. Atherosclerotic calcifications are present along the wall of distal right ICA, just below the skull base. Vertebral arteries: The left vertebral artery is dominant vessel. Both vertebral arteries  originate from the subclavian arteries. Segmental narrowing is present in the distal right P2 segment. High-grade stenosis is present in the right vertebral artery at the dural margin. Skeleton: Anterior fusion is present at C6-7. Adjacent level degenerative changes are present C5-6 and to lesser extent at C4-5. Slight anterolisthesis present at C4-5. Straightening of the normal cervical lordosis is present. Other neck: The soft tissues the neck are otherwise unremarkable. Upper chest: Lung apices are clear. Review of the MIP images confirms the above findings CTA HEAD FINDINGS Anterior circulation: Atherosclerotic calcifications are present within the cavernous internal carotid arteries bilaterally. Mild narrowing of less than 50% is present bilaterally. ICA termini within normal limits. The A1 and M1 segments are normal. The anterior communicating artery is patent. MCA bifurcations are within normal limits. ACA and MCA branch vessels are within normal limits. Posterior circulation: Left vertebral artery is dominant vessel. Left PICA origin is visualized and normal. High-grade stenosis is present at the dural margin of the right vertebral artery. The right V4 segment is occluded beyond the PICA. Basilar artery is normal. Both posterior cerebral arteries originate from the basilar tip. PCA branch vessels are within normal limits. Venous sinuses: Dural sinuses are patent. The straight sinus deep cerebral veins are intact. Cortical veins are within normal limits. Anatomic variants: None Review of the MIP images confirms the above findings IMPRESSION: 1. 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 2. High-grade, near occlusive stenosis of the proximal  right internal carotid artery measuring up to 1 mm. 3. High-grade stenosis of the right vertebral artery at the dural margin. 4. Atherosclerotic changes at the left carotid bifurcation and cavernous internal carotid arteries bilaterally without significant stenosis  relative to the more distal vessels. 5. No other significant proximal stenosis, aneurysm, or branch vessel occlusion within the Circle of Willis. 6. Multilevel spondylosis of the cervical spine. 7. Aortic Atherosclerosis (ICD10-I70.0). Electronically Signed   By: San Morelle M.D.   On: 09/22/2020 16:38   CT ANGIO CHEST PE W OR WO CONTRAST  Result Date: 09/21/2020 CLINICAL DATA:  Syncope, dizziness after cooking breakfast, lightheadedness EXAM: CT ANGIOGRAPHY CHEST WITH CONTRAST TECHNIQUE: Multidetector CT imaging of the chest was performed using the standard protocol during bolus administration of intravenous contrast. Multiplanar CT image reconstructions and MIPs were obtained to evaluate the vascular anatomy. CONTRAST:  22mL OMNIPAQUE IOHEXOL 350 MG/ML SOLN COMPARISON:  CT chest 08/26/2006 FINDINGS: Cardiovascular: Satisfactory opacification the pulmonary arteries to the segmental level. No pulmonary artery filling defects are identified. Central pulmonary arteries are normal caliber. Normal heart size. No pericardial effusion. Three-vessel coronary artery atherosclerosis. Suboptimal opacification of the aorta. Atherosclerotic plaque within the normal caliber aorta. No gross luminal abnormality. No periaortic stranding or hemorrhage. Shared origin of the brachiocephalic and left common carotid arteries. Minimal plaque in the otherwise unremarkable proximal great vessels. Mediastinum/Nodes: No mediastinal fluid or gas. Normal thyroid gland and thoracic inlet. No acute abnormality of the trachea or esophagus. No worrisome mediastinal, hilar or axillary adenopathy. Lungs/Pleura: Dependent atelectatic changes. No consolidation, features of edema, pneumothorax, or effusion. Tiny 3 mm juxtapleural nodule along the right hemidiaphragm (6/50, 7/36), possibly subpleural lymph node. No other concerning nodules or masses. Mild biapical pleuroparenchymal scarring. Upper Abdomen: No acute abnormalities present in  the visualized portions of the upper abdomen. Punctate calcification in the liver, may reflect remote sequela of prior granulomatous disease. Musculoskeletal: No acute osseous abnormality or suspicious osseous lesion. Multilevel degenerative changes are present in the imaged portions of the spine. Additional degenerative changes in the shoulders. No worrisome chest wall lesions. Review of the MIP images confirms the above findings. IMPRESSION: 1. No evidence of pulmonary embolism. 2. No acute intrathoracic process. 3. Three-vessel coronary artery atherosclerosis. 4. Aortic Atherosclerosis (ICD10-I70.0). Electronically Signed   By: Lovena Le M.D.   On: 09/21/2020 16:50   MR BRAIN WO CONTRAST  Result Date: 09/21/2020 CLINICAL DATA:  Initial evaluation for acute syncope. EXAM: MRI HEAD WITHOUT CONTRAST TECHNIQUE: Multiplanar, multiecho pulse sequences of the brain and surrounding structures were obtained without intravenous contrast. COMPARISON:  Prior CT from 09/21/2020. FINDINGS: Brain: Cerebral volume within normal limits for age. No significant cerebral white matter disease. 12 mm remote lacunar infarct noted involving the ventral left paramedian pons (series 16, image 55). Patchy small volume diffusion signal abnormality seen involving the right cerebellum, consistent with acute to early subacute ischemic infarcts (series 5, images 12, 11). The largest of these foci measures 5 mm. No associated hemorrhage or mass effect. No other diffusion abnormality to suggest acute or subacute ischemia. Gray-white matter differentiation otherwise maintained. No other areas of encephalomalacia to suggest chronic cortical infarction. No foci of susceptibility artifact to suggest acute or chronic intracranial hemorrhage. No mass lesion, midline shift or mass effect. No hydrocephalus or extra-axial fluid collection. Pituitary gland and suprasellar region within normal limits. Midline structures intact. Vascular:  Heterogeneous flow void within the right vertebral artery, which could be related to slow flow and/or occlusion (series 10, image  3). Major intracranial vascular flow voids otherwise maintained. Skull and upper cervical spine: Craniocervical junction within normal limits. Bone marrow signal intensity normal. No scalp soft tissue abnormality. Sinuses/Orbits: Globes and orbital soft tissues within normal limits. Scattered mucosal thickening noted throughout the ethmoidal air cells and maxillary sinuses. No mastoid effusion. Inner ear structures grossly normal. Other: None. IMPRESSION: 1. Patchy small volume acute to early subacute ischemic infarcts involving the right cerebellum. No associated hemorrhage or mass effect. 2. 12 mm remote lacunar infarct involving the ventral left paramedian pons. 3. Heterogeneous flow void within the right vertebral artery, which could be related to slow flow and/or occlusion. 4. Otherwise normal brain MRI for age. Electronically Signed   By: Jeannine Boga M.D.   On: 09/21/2020 22:21   US Venous Img Lower Bilateral (DVT)  Result Date: 09/21/2020 CLINICAL DATA:  Pain, positive D-dimer EXAM: BILATERAL LOWER EXTREMITY VENOUS DOPPLER ULTRASOUND TECHNIQUE: Gray-scale sonography with compression, as well as color and duplex ultrasound, were performed to evaluate the deep venous system(s) from the level of the common femoral vein through the popliteal and proximal calf veins. COMPARISON:  None. FINDINGS: VENOUS Normal compressibility of the common femoral, superficial femoral, and popliteal veins, as well as the visualized calf veins. Visualized portions of profunda femoral vein and great saphenous vein unremarkable. No filling defects to suggest DVT on grayscale or color Doppler imaging. Doppler waveforms show normal direction of venous flow, normal respiratory plasticity and response to augmentation. Limited views of the contralateral common femoral vein are unremarkable. OTHER  None. Limitations: none IMPRESSION: No acute DVT. Electronically Signed   By: Valentino Saxon MD   On: 09/21/2020 17:17   ECHOCARDIOGRAM COMPLETE  Result Date: 09/22/2020    ECHOCARDIOGRAM REPORT   Patient Name:   Latoya Herman Date of Exam: 09/22/2020 Medical Rec #:  [id number]       Height:       65.0 in Accession #:    [id number]      Weight:       137.1 lb Date of Birth:  [birth year]        BSA:          1.685 m Patient Age:    58 years        BP:           141/75 mmHg Patient Gender: F               HR:           79 bpm. Exam Location:  [facility] Procedure: 2D Echo, Color Doppler and Cardiac Doppler Indications:     R55 Syncope  History:         Patient has no prior history of Echocardiogram examinations.                  CAD, CKD; Risk Factors:Hypertension, Diabetes and Dyslipidemia.  Sonographer:     Charmayne Sheer RDCS (AE) Referring Phys:  [id number] Lorenza Chick Diagnosing Phys: Isaias Cowman MD IMPRESSIONS  1. Left ventricular ejection fraction, by estimation, is 55 to 60%. The left ventricle has normal function. The left ventricle has no regional wall motion abnormalities. Left ventricular diastolic parameters are consistent with Grade I diastolic dysfunction (impaired relaxation).  2. Right ventricular systolic function is normal. The right ventricular size is normal.  3. The mitral valve is normal in structure. Trivial mitral valve regurgitation. No evidence of mitral stenosis.  4. The aortic valve is normal in structure. Aortic valve regurgitation  is mild. No aortic stenosis is present.  5. The inferior vena cava is normal in size with greater than 50% respiratory variability, suggesting right atrial pressure of 3 mmHg. FINDINGS  Left Ventricle: Left ventricular ejection fraction, by estimation, is 55 to 60%. The left ventricle has normal function. The left ventricle has no regional wall motion abnormalities. The left ventricular internal cavity size was normal in size. There is  no left  ventricular hypertrophy. Left ventricular diastolic parameters are consistent with Grade I diastolic dysfunction (impaired relaxation). Right Ventricle: The right ventricular size is normal. No increase in right ventricular wall thickness. Right ventricular systolic function is normal. Left Atrium: Left atrial size was normal in size. Right Atrium: Right atrial size was normal in size. Pericardium: There is no evidence of pericardial effusion. Mitral Valve: The mitral valve is normal in structure. Trivial mitral valve regurgitation. No evidence of mitral valve stenosis. MV peak gradient, 4.2 mmHg. The mean mitral valve gradient is 1.0 mmHg. Tricuspid Valve: The tricuspid valve is normal in structure. Tricuspid valve regurgitation is trivial. No evidence of tricuspid stenosis. Aortic Valve: The aortic valve is normal in structure. Aortic valve regurgitation is mild. No aortic stenosis is present. Aortic valve mean gradient measures 4.0 mmHg. Aortic valve peak gradient measures 7.2 mmHg. Aortic valve area, by VTI measures 1.47 cm. Pulmonic Valve: The pulmonic valve was normal in structure. Pulmonic valve regurgitation is not visualized. No evidence of pulmonic stenosis. Aorta: The aortic root is normal in size and structure. Venous: The inferior vena cava is normal in size with greater than 50% respiratory variability, suggesting right atrial pressure of 3 mmHg. IAS/Shunts: No atrial level shunt detected by color flow Doppler.  LEFT VENTRICLE PLAX 2D LVIDd:         4.00 cm  Diastology LVIDs:         2.67 cm  LV e' medial:    4.46 cm/s LV PW:         0.87 cm  LV E/e' medial:  15.7 LV IVS:        0.93 cm  LV e' lateral:   4.57 cm/s LVOT diam:     1.60 cm  LV E/e' lateral: 15.3 LV SV:         37 LV SV Index:   22 LVOT Area:     2.01 cm  RIGHT VENTRICLE RV Basal diam:  2.50 cm LEFT ATRIUM             Index       RIGHT ATRIUM          Index LA diam:        3.60 cm 2.14 cm/m  RA Area:     9.70 cm LA Vol (A2C):   20.4 ml  12.11 ml/m RA Volume:   19.70 ml 11.69 ml/m LA Vol (A4C):   33.8 ml 20.06 ml/m LA Biplane Vol: 28.2 ml 16.74 ml/m  AORTIC VALVE                   PULMONIC VALVE AV Area (Vmax):    1.56 cm    PV Vmax:       1.03 m/s AV Area (Vmean):   1.43 cm    PV Vmean:      73.600 cm/s AV Area (VTI):     1.47 cm    PV VTI:        0.219 m AV Vmax:  134.00 cm/s PV Peak grad:  4.2 mmHg AV Vmean:          97.900 cm/s PV Mean grad:  2.0 mmHg AV VTI:            0.250 m AV Peak Grad:      7.2 mmHg AV Mean Grad:      4.0 mmHg LVOT Vmax:         104.00 cm/s LVOT Vmean:        69.500 cm/s LVOT VTI:          0.183 m LVOT/AV VTI ratio: 0.73  AORTA Ao Root diam: 2.80 cm MITRAL VALVE MV Area (PHT): 3.95 cm    SHUNTS MV Peak grad:  4.2 mmHg    Systemic VTI:  0.18 m MV Mean grad:  1.0 mmHg    Systemic Diam: 1.60 cm MV Vmax:       1.03 m/s MV Vmean:      52.9 cm/s MV Decel Time: 192 msec MV E velocity: 69.80 cm/s MV A velocity: 93.40 cm/s MV E/A ratio:  0.75 Isaias Cowman MD Electronically signed by Isaias Cowman MD Signature Date/Time: 09/22/2020/2:52:59 PM    Final     Medications:  I have reviewed the patient's current medications. Scheduled: . aspirin  81 mg Oral Daily  . atorvastatin  80 mg Oral Daily  . carvedilol  25 mg Oral BID WC  . clopidogrel  75 mg Oral Daily  . enoxaparin (LOVENOX) injection  40 mg Subcutaneous Q24H  . levothyroxine  100 mcg Oral Q0600  . sodium chloride flush  3 mL Intravenous Q12H  . spironolactone  25 mg Oral Daily    Assessment/Plan:  69 year old with  with past medical history significant for hypertension, hyperlipidemia, diabetes complicated by peripheral neuropathy, coronary artery disease status post LAD stent (2014, on Plavix monotherapy and atorvastatin 80), hypothyroidism, cervical spine fusion (~2011) prior OSA diagnosis on CPAP now non-compliant presenting with complaints of dizziness.  Symptoms now resolved.  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  Atherosclerotic disease likely etiology for acute infarct but RICA stenosis continues to place patient at high stroke risk despite currently being asymptomatic.  Echocardiogram shows no cardiac source of emboli with an EF of 55-60%.  A1c 6.5, LDL 95.  Recommendations: 1. Continue ASA and Plavix as recommended 2. Vascular surgery to evaluate patient for right ICA stenosis.   3. LDL 95 despite maximum dose of atorvastatin, please consider referral to lipid clinic for PSK 9 inhibitor 4. Outpatient neurology follow up    LOS: 1 day   Alexis Goodell, MD Neurology [phone] 09/23/2020  11:43 AM

## 2020-09-23 NOTE — Discharge Summary (Signed)
Physician Discharge Summary  Latoya Herman OZH:[id number] DOB: 10-08-1951 DOA: 09/21/2020  PCP: Kirk Ruths, MD  Admit date: 09/21/2020 Discharge date: 09/23/2020  Admitted From: Home Disposition: Home  Recommendations for Outpatient Follow-up:  1. Follow up with PCP in 1-2 weeks 2. Please obtain BMP/CBC in one week 3. Please follow up on the following pending results: None  Home Health: No Equipment/Devices: None Discharge Condition: Stable CODE STATUS: Full Diet recommendation: Heart Healthy / Carb Modified   Brief/Interim Summary: Latoya Herman a 69 y.o.femalewith medical history significant ofhypertension, hyperlipidemia, diabetes mellitus, CAD, with stent placement, hypothyroidism, who presents with dizziness near syncope.  She was found to have an acute right cerebellar stroke on MRI. Patient underwent stroke work-up and it shows significant stenosis of right internal carotid and vertebral artery.  Neurology was also consulted. Vascular surgery was consulted at the request of neurology and they will see her as an outpatient with a possible intervention 3 weeks after the acute stroke.  Patient will take aspirin and Plavix together for 3 weeks followed by Plavix only.  Symptoms resolved on discharge.  No focal deficit.  Patient to head elevated troponin which peaked at 800, no chest pain or shortness of breath.  Due to her prior history of CAD cardiology was consulted and they recommend outpatient follow-up.  Echocardiogram without any significant changes.  Patient will continue with her home meds and follow-up with her providers..  Discharge Diagnoses:  Principal Problem:   Near syncope Active Problems:   Coronary artery disease   Hypertension   Hyperlipidemia   Diabetes mellitus without complication (HCC)   Elevated troponin   Leukocytosis   Hypothyroidism   Acute ischemic stroke [facility])  Discharge Instructions  Discharge Instructions    Diet - low  sodium heart healthy   Complete by: As directed    Discharge instructions   Complete by: As directed    It was pleasure taking care of you. As we discussed you do have a stroke in the back of your head.  Continue taking aspirin and Plavix together for 3 weeks and then continue with Plavix only. As your imaging shows some blockage in big vessels, we consulted vascular surgery and they would like to see you in clinic with a possible procedure in 2 to 3 weeks time after the stroke.  Please follow-up with them in their clinic according to your appointment. Continue taking rest of your medications and follow-up with your primary care provider.   Increase activity slowly   Complete by: As directed      Allergies as of 09/23/2020      Reactions   Penicillins Swelling, Other (See Comments)   Lisinopril Rash   Possible rash Possible rash      Medication List    STOP taking these medications   pioglitazone 30 MG tablet Commonly known as: ACTOS     TAKE these medications   aspirin 81 MG chewable tablet Chew 1 tablet (81 mg total) by mouth daily. Start taking on: September 24, 2020   atorvastatin 80 MG tablet Commonly known as: LIPITOR Take 80 mg by mouth daily.   carvedilol 25 MG tablet Commonly known as: COREG Take 25 mg by mouth 2 (two) times daily with a meal.   clopidogrel 75 MG tablet Commonly known as: PLAVIX Take 75 mg by mouth daily.   fluticasone 50 MCG/ACT nasal spray Commonly known as: FLONASE Place 2 sprays into both nostrils daily.   gabapentin 100 MG capsule Commonly  known as: NEURONTIN Take 100 mg by mouth 3 (three) times daily.   levothyroxine 100 MCG tablet Commonly known as: SYNTHROID Take 100 mcg by mouth daily before breakfast.   metFORMIN 1000 MG tablet Commonly known as: GLUCOPHAGE Take 500 mg by mouth 2 (two) times daily.   Ozempic (0.25 or 0.5 MG/DOSE) 2 MG/1.5ML Sopn Generic drug: Semaglutide(0.25 or 0.5MG /DOS) Inject 0.25 mg into the skin  once a week.   spironolactone 25 MG tablet Commonly known as: ALDACTONE Take 25 mg by mouth daily.       Follow-up Information    Schnier, Dolores Lory, MD Follow up in 1 week(s).   Specialties: Vascular Surgery, Cardiology, Radiology, Vascular Surgery Why: To see Schnier only. Review CTA neck.  Contact information: [place] [address] [phone]        Kirk Ruths, MD. Schedule an appointment as soon as possible for a visit.   Specialty: Internal Medicine Contact information: [address] [facility] [place] [address] [phone]              Allergies  Allergen Reactions  . Penicillins Swelling and Other (See Comments)  . Lisinopril Rash    Possible rash Possible rash     Consultations:  Cardiology  Neurology  Vascular surgery  Procedures/Studies: CT Code Stroke CTA Head W/WO contrast  Result Date: 09/22/2020 CLINICAL DATA:  Stroke/TIA. Sudden onset of dizziness and lightheadedness yesterday. Symptoms lasted for 1 hour. Abnormal MRI. 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 EXAM: CT ANGIOGRAPHY HEAD AND NECK TECHNIQUE: Multidetector CT imaging of the head and neck was performed using the standard protocol during bolus administration of intravenous contrast. Multiplanar CT image reconstructions and MIPs were obtained to evaluate the vascular anatomy. Carotid stenosis measurements (when applicable) are obtained utilizing NASCET criteria, using the distal internal carotid diameter as the denominator. CONTRAST:  48mL OMNIPAQUE IOHEXOL 350 MG/ML SOLN COMPARISON:  MR head without contrast 09/21/2020. FINDINGS: Brain: Right cerebellar infarcts are less well seen than on the MRI. No new infarcts are present. No acute hemorrhage or mass lesion is present. The ventricles are of normal size. No significant extraaxial fluid collection is present. Vascular: Extensive atherosclerotic calcifications are  present within the cavernous internal carotid arteries bilaterally. No hyperdense vessel is present. Skull: Calvarium is intact. No focal lytic or blastic lesions are present. No significant extracranial soft tissue lesion is present. Sinuses: The paranasal sinuses and mastoid air cells are clear. Orbits: The globes and orbits are within normal limits. CTA NECK FINDINGS Aortic arch: Atherosclerotic calcifications are present within the distal arch including the origin of the left subclavian artery. No significant stenosis is present. Common origin of the left common carotid artery and innominate artery is noted. Right carotid system: Right common carotid artery is within normal limits. The proximal right ICA is narrowed to 1 mm. More distal cervical right ICA is within normal limits. Left carotid system: Left common carotid artery demonstrates some mural calcification without significant stenosis. Atherosclerotic changes are present at the bifurcation. No significant stenosis is present relative to the more distal vessel. Atherosclerotic calcifications are present along the wall of distal right ICA, just below the skull base. Vertebral arteries: The left vertebral artery is dominant vessel. Both vertebral arteries originate from the subclavian arteries. Segmental narrowing is present in the distal right P2 segment. High-grade stenosis is present in the right vertebral artery at the dural margin. Skeleton: Anterior fusion is present at C6-7. Adjacent  level degenerative changes are present C5-6 and to lesser extent at C4-5. Slight anterolisthesis present at C4-5. Straightening of the normal cervical lordosis is present. Other neck: The soft tissues the neck are otherwise unremarkable. Upper chest: Lung apices are clear. Review of the MIP images confirms the above findings CTA HEAD FINDINGS Anterior circulation: Atherosclerotic calcifications are present within the cavernous internal carotid arteries bilaterally. Mild  narrowing of less than 50% is present bilaterally. ICA termini within normal limits. The A1 and M1 segments are normal. The anterior communicating artery is patent. MCA bifurcations are within normal limits. ACA and MCA branch vessels are within normal limits. Posterior circulation: Left vertebral artery is dominant vessel. Left PICA origin is visualized and normal. High-grade stenosis is present at the dural margin of the right vertebral artery. The right V4 segment is occluded beyond the PICA. Basilar artery is normal. Both posterior cerebral arteries originate from the basilar tip. PCA branch vessels are within normal limits. Venous sinuses: Dural sinuses are patent. The straight sinus deep cerebral veins are intact. Cortical veins are within normal limits. Anatomic variants: None Review of the MIP images confirms the above findings IMPRESSION: 1. 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 2. High-grade, near occlusive stenosis of the proximal right internal carotid artery measuring up to 1 mm. 3. High-grade stenosis of the right vertebral artery at the dural margin. 4. Atherosclerotic changes at the left carotid bifurcation and cavernous internal carotid arteries bilaterally without significant stenosis relative to the more distal vessels. 5. No other significant proximal stenosis, aneurysm, or branch vessel occlusion within the Circle of Willis. 6. Multilevel spondylosis of the cervical spine. 7. Aortic Atherosclerosis (ICD10-I70.0). Electronically Signed   By: San Morelle M.D.   On: 09/22/2020 16:38   DG Chest 2 View  Result Date: 09/21/2020 CLINICAL DATA:  Dizziness. EXAM: CHEST - 2 VIEW COMPARISON:  Chest radiograph 07/14/2005. FINDINGS: The heart size and mediastinal contours are within normal limits. Both lungs are clear. The visualized skeletal structures are unremarkable. IMPRESSION: No active cardiopulmonary disease. Electronically Signed   By: Lovey Newcomer M.D.   On: 09/21/2020  14:14   CT Head Wo Contrast  Result Date: 09/21/2020 CLINICAL DATA:  Patient with dizziness. EXAM: CT HEAD WITHOUT CONTRAST TECHNIQUE: Contiguous axial images were obtained from the base of the skull through the vertex without intravenous contrast. COMPARISON:  None. FINDINGS: Brain: Ventricles and sulci are appropriate for patient's age. No evidence for acute cortically based infarct, intracranial hemorrhage, mass lesion or mass-effect. Vascular: Unremarkable Skull: Intact. Sinuses/Orbits: Paranasal sinuses well aerated. Mastoid air cells unremarkable. Orbits unremarkable. Other: None. IMPRESSION: No acute intracranial process. Electronically Signed   By: Lovey Newcomer M.D.   On: 09/21/2020 14:25   CT Code Stroke CTA Neck W/WO contrast  Result Date: 09/22/2020 CLINICAL DATA:  Stroke/TIA. Sudden onset of dizziness and lightheadedness yesterday. Symptoms lasted for 1 hour. Abnormal MRI. 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 EXAM: CT ANGIOGRAPHY HEAD AND NECK TECHNIQUE: Multidetector CT imaging of the head and neck was performed using the standard protocol during bolus administration of intravenous contrast. Multiplanar CT image reconstructions and MIPs were obtained to evaluate the vascular anatomy. Carotid stenosis measurements (when applicable) are obtained utilizing NASCET criteria, using the distal internal carotid diameter as the denominator. CONTRAST:  16mL OMNIPAQUE IOHEXOL 350 MG/ML SOLN COMPARISON:  MR head without contrast 09/21/2020. FINDINGS: Brain: Right cerebellar infarcts are less well seen than on the MRI. No new infarcts are present. No  acute hemorrhage or mass lesion is present. The ventricles are of normal size. No significant extraaxial fluid collection is present. Vascular: Extensive atherosclerotic calcifications are present within the cavernous internal carotid arteries bilaterally. No hyperdense vessel is present. Skull: Calvarium is intact. No focal lytic or blastic  lesions are present. No significant extracranial soft tissue lesion is present. Sinuses: The paranasal sinuses and mastoid air cells are clear. Orbits: The globes and orbits are within normal limits. CTA NECK FINDINGS Aortic arch: Atherosclerotic calcifications are present within the distal arch including the origin of the left subclavian artery. No significant stenosis is present. Common origin of the left common carotid artery and innominate artery is noted. Right carotid system: Right common carotid artery is within normal limits. The proximal right ICA is narrowed to 1 mm. More distal cervical right ICA is within normal limits. Left carotid system: Left common carotid artery demonstrates some mural calcification without significant stenosis. Atherosclerotic changes are present at the bifurcation. No significant stenosis is present relative to the more distal vessel. Atherosclerotic calcifications are present along the wall of distal right ICA, just below the skull base. Vertebral arteries: The left vertebral artery is dominant vessel. Both vertebral arteries originate from the subclavian arteries. Segmental narrowing is present in the distal right P2 segment. High-grade stenosis is present in the right vertebral artery at the dural margin. Skeleton: Anterior fusion is present at C6-7. Adjacent level degenerative changes are present C5-6 and to lesser extent at C4-5. Slight anterolisthesis present at C4-5. Straightening of the normal cervical lordosis is present. Other neck: The soft tissues the neck are otherwise unremarkable. Upper chest: Lung apices are clear. Review of the MIP images confirms the above findings CTA HEAD FINDINGS Anterior circulation: Atherosclerotic calcifications are present within the cavernous internal carotid arteries bilaterally. Mild narrowing of less than 50% is present bilaterally. ICA termini within normal limits. The A1 and M1 segments are normal. The anterior communicating artery  is patent. MCA bifurcations are within normal limits. ACA and MCA branch vessels are within normal limits. Posterior circulation: Left vertebral artery is dominant vessel. Left PICA origin is visualized and normal. High-grade stenosis is present at the dural margin of the right vertebral artery. The right V4 segment is occluded beyond the PICA. Basilar artery is normal. Both posterior cerebral arteries originate from the basilar tip. PCA branch vessels are within normal limits. Venous sinuses: Dural sinuses are patent. The straight sinus deep cerebral veins are intact. Cortical veins are within normal limits. Anatomic variants: None Review of the MIP images confirms the above findings IMPRESSION: 1. 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 2. High-grade, near occlusive stenosis of the proximal right internal carotid artery measuring up to 1 mm. 3. High-grade stenosis of the right vertebral artery at the dural margin. 4. Atherosclerotic changes at the left carotid bifurcation and cavernous internal carotid arteries bilaterally without significant stenosis relative to the more distal vessels. 5. No other significant proximal stenosis, aneurysm, or branch vessel occlusion within the Circle of Willis. 6. Multilevel spondylosis of the cervical spine. 7. Aortic Atherosclerosis (ICD10-I70.0). Electronically Signed   By: San Morelle M.D.   On: 09/22/2020 16:38   CT ANGIO CHEST PE W OR WO CONTRAST  Result Date: 09/21/2020 CLINICAL DATA:  Syncope, dizziness after cooking breakfast, lightheadedness EXAM: CT ANGIOGRAPHY CHEST WITH CONTRAST TECHNIQUE: Multidetector CT imaging of the chest was performed using the standard protocol during bolus administration of intravenous contrast. Multiplanar CT image reconstructions and MIPs were obtained  to evaluate the vascular anatomy. CONTRAST:  48mL OMNIPAQUE IOHEXOL 350 MG/ML SOLN COMPARISON:  CT chest 08/26/2006 FINDINGS: Cardiovascular: Satisfactory opacification  the pulmonary arteries to the segmental level. No pulmonary artery filling defects are identified. Central pulmonary arteries are normal caliber. Normal heart size. No pericardial effusion. Three-vessel coronary artery atherosclerosis. Suboptimal opacification of the aorta. Atherosclerotic plaque within the normal caliber aorta. No gross luminal abnormality. No periaortic stranding or hemorrhage. Shared origin of the brachiocephalic and left common carotid arteries. Minimal plaque in the otherwise unremarkable proximal great vessels. Mediastinum/Nodes: No mediastinal fluid or gas. Normal thyroid gland and thoracic inlet. No acute abnormality of the trachea or esophagus. No worrisome mediastinal, hilar or axillary adenopathy. Lungs/Pleura: Dependent atelectatic changes. No consolidation, features of edema, pneumothorax, or effusion. Tiny 3 mm juxtapleural nodule along the right hemidiaphragm (6/50, 7/36), possibly subpleural lymph node. No other concerning nodules or masses. Mild biapical pleuroparenchymal scarring. Upper Abdomen: No acute abnormalities present in the visualized portions of the upper abdomen. Punctate calcification in the liver, may reflect remote sequela of prior granulomatous disease. Musculoskeletal: No acute osseous abnormality or suspicious osseous lesion. Multilevel degenerative changes are present in the imaged portions of the spine. Additional degenerative changes in the shoulders. No worrisome chest wall lesions. Review of the MIP images confirms the above findings. IMPRESSION: 1. No evidence of pulmonary embolism. 2. No acute intrathoracic process. 3. Three-vessel coronary artery atherosclerosis. 4. Aortic Atherosclerosis (ICD10-I70.0). Electronically Signed   By: Lovena Le M.D.   On: 09/21/2020 16:50   MR BRAIN WO CONTRAST  Result Date: 09/21/2020 CLINICAL DATA:  Initial evaluation for acute syncope. EXAM: MRI HEAD WITHOUT CONTRAST TECHNIQUE: Multiplanar, multiecho pulse sequences  of the brain and surrounding structures were obtained without intravenous contrast. COMPARISON:  Prior CT from 09/21/2020. FINDINGS: Brain: Cerebral volume within normal limits for age. No significant cerebral white matter disease. 12 mm remote lacunar infarct noted involving the ventral left paramedian pons (series 16, image 55). Patchy small volume diffusion signal abnormality seen involving the right cerebellum, consistent with acute to early subacute ischemic infarcts (series 5, images 12, 11). The largest of these foci measures 5 mm. No associated hemorrhage or mass effect. No other diffusion abnormality to suggest acute or subacute ischemia. Gray-white matter differentiation otherwise maintained. No other areas of encephalomalacia to suggest chronic cortical infarction. No foci of susceptibility artifact to suggest acute or chronic intracranial hemorrhage. No mass lesion, midline shift or mass effect. No hydrocephalus or extra-axial fluid collection. Pituitary gland and suprasellar region within normal limits. Midline structures intact. Vascular: Heterogeneous flow void within the right vertebral artery, which could be related to slow flow and/or occlusion (series 10, image 3). Major intracranial vascular flow voids otherwise maintained. Skull and upper cervical spine: Craniocervical junction within normal limits. Bone marrow signal intensity normal. No scalp soft tissue abnormality. Sinuses/Orbits: Globes and orbital soft tissues within normal limits. Scattered mucosal thickening noted throughout the ethmoidal air cells and maxillary sinuses. No mastoid effusion. Inner ear structures grossly normal. Other: None. IMPRESSION: 1. Patchy small volume acute to early subacute ischemic infarcts involving the right cerebellum. No associated hemorrhage or mass effect. 2. 12 mm remote lacunar infarct involving the ventral left paramedian pons. 3. Heterogeneous flow void within the right vertebral artery, which could be  related to slow flow and/or occlusion. 4. Otherwise normal brain MRI for age. Electronically Signed   By: Jeannine Boga M.D.   On: 09/21/2020 22:21   US Venous Img Lower Bilateral (DVT)  Result  Date: 09/21/2020 CLINICAL DATA:  Pain, positive D-dimer EXAM: BILATERAL LOWER EXTREMITY VENOUS DOPPLER ULTRASOUND TECHNIQUE: Gray-scale sonography with compression, as well as color and duplex ultrasound, were performed to evaluate the deep venous system(s) from the level of the common femoral vein through the popliteal and proximal calf veins. COMPARISON:  None. FINDINGS: VENOUS Normal compressibility of the common femoral, superficial femoral, and popliteal veins, as well as the visualized calf veins. Visualized portions of profunda femoral vein and great saphenous vein unremarkable. No filling defects to suggest DVT on grayscale or color Doppler imaging. Doppler waveforms show normal direction of venous flow, normal respiratory plasticity and response to augmentation. Limited views of the contralateral common femoral vein are unremarkable. OTHER None. Limitations: none IMPRESSION: No acute DVT. Electronically Signed   By: Valentino Saxon MD   On: 09/21/2020 17:17   ECHOCARDIOGRAM COMPLETE  Result Date: 09/22/2020    ECHOCARDIOGRAM REPORT   Patient Name:   Latoya Herman Date of Exam: 09/22/2020 Medical Rec #:  [id number]       Height:       65.0 in Accession #:    [id number]      Weight:       137.1 lb Date of Birth:  October 09, 1951        BSA:          1.685 m Patient Age:    20 years        BP:           141/75 mmHg Patient Gender: F               HR:           79 bpm. Exam Location:  [facility] Procedure: 2D Echo, Color Doppler and Cardiac Doppler Indications:     R55 Syncope  History:         Patient has no prior history of Echocardiogram examinations.                  CAD, CKD; Risk Factors:Hypertension, Diabetes and Dyslipidemia.  Sonographer:     Charmayne Sheer RDCS (AE) Referring Phys:  [id number] Lorenza Chick  Diagnosing Phys: Isaias Cowman MD IMPRESSIONS  1. Left ventricular ejection fraction, by estimation, is 55 to 60%. The left ventricle has normal function. The left ventricle has no regional wall motion abnormalities. Left ventricular diastolic parameters are consistent with Grade I diastolic dysfunction (impaired relaxation).  2. Right ventricular systolic function is normal. The right ventricular size is normal.  3. The mitral valve is normal in structure. Trivial mitral valve regurgitation. No evidence of mitral stenosis.  4. The aortic valve is normal in structure. Aortic valve regurgitation is mild. No aortic stenosis is present.  5. The inferior vena cava is normal in size with greater than 50% respiratory variability, suggesting right atrial pressure of 3 mmHg. FINDINGS  Left Ventricle: Left ventricular ejection fraction, by estimation, is 55 to 60%. The left ventricle has normal function. The left ventricle has no regional wall motion abnormalities. The left ventricular internal cavity size was normal in size. There is  no left ventricular hypertrophy. Left ventricular diastolic parameters are consistent with Grade I diastolic dysfunction (impaired relaxation). Right Ventricle: The right ventricular size is normal. No increase in right ventricular wall thickness. Right ventricular systolic function is normal. Left Atrium: Left atrial size was normal in size. Right Atrium: Right atrial size was normal in size. Pericardium: There is no evidence of pericardial effusion. Mitral Valve: The mitral valve  is normal in structure. Trivial mitral valve regurgitation. No evidence of mitral valve stenosis. MV peak gradient, 4.2 mmHg. The mean mitral valve gradient is 1.0 mmHg. Tricuspid Valve: The tricuspid valve is normal in structure. Tricuspid valve regurgitation is trivial. No evidence of tricuspid stenosis. Aortic Valve: The aortic valve is normal in structure. Aortic valve regurgitation is mild. No aortic  stenosis is present. Aortic valve mean gradient measures 4.0 mmHg. Aortic valve peak gradient measures 7.2 mmHg. Aortic valve area, by VTI measures 1.47 cm. Pulmonic Valve: The pulmonic valve was normal in structure. Pulmonic valve regurgitation is not visualized. No evidence of pulmonic stenosis. Aorta: The aortic root is normal in size and structure. Venous: The inferior vena cava is normal in size with greater than 50% respiratory variability, suggesting right atrial pressure of 3 mmHg. IAS/Shunts: No atrial level shunt detected by color flow Doppler.  LEFT VENTRICLE PLAX 2D LVIDd:         4.00 cm  Diastology LVIDs:         2.67 cm  LV e' medial:    4.46 cm/s LV PW:         0.87 cm  LV E/e' medial:  15.7 LV IVS:        0.93 cm  LV e' lateral:   4.57 cm/s LVOT diam:     1.60 cm  LV E/e' lateral: 15.3 LV SV:         37 LV SV Index:   22 LVOT Area:     2.01 cm  RIGHT VENTRICLE RV Basal diam:  2.50 cm LEFT ATRIUM             Index       RIGHT ATRIUM          Index LA diam:        3.60 cm 2.14 cm/m  RA Area:     9.70 cm LA Vol (A2C):   20.4 ml 12.11 ml/m RA Volume:   19.70 ml 11.69 ml/m LA Vol (A4C):   33.8 ml 20.06 ml/m LA Biplane Vol: 28.2 ml 16.74 ml/m  AORTIC VALVE                   PULMONIC VALVE AV Area (Vmax):    1.56 cm    PV Vmax:       1.03 m/s AV Area (Vmean):   1.43 cm    PV Vmean:      73.600 cm/s AV Area (VTI):     1.47 cm    PV VTI:        0.219 m AV Vmax:           134.00 cm/s PV Peak grad:  4.2 mmHg AV Vmean:          97.900 cm/s PV Mean grad:  2.0 mmHg AV VTI:            0.250 m AV Peak Grad:      7.2 mmHg AV Mean Grad:      4.0 mmHg LVOT Vmax:         104.00 cm/s LVOT Vmean:        69.500 cm/s LVOT VTI:          0.183 m LVOT/AV VTI ratio: 0.73  AORTA Ao Root diam: 2.80 cm MITRAL VALVE MV Area (PHT): 3.95 cm    SHUNTS MV Peak grad:  4.2 mmHg    Systemic VTI:  0.18 m MV Mean grad:  1.0 mmHg  Systemic Diam: 1.60 cm MV Vmax:       1.03 m/s MV Vmean:      52.9 cm/s MV Decel Time: 192 msec  MV E velocity: 69.80 cm/s MV A velocity: 93.40 cm/s MV E/A ratio:  0.75 Isaias Cowman MD Electronically signed by Isaias Cowman MD Signature Date/Time: 09/22/2020/2:52:59 PM    Final      Subjective:   Discharge Exam: Vitals:   09/23/20 1132 09/23/20 1557  BP: 127/87 (!) 150/73  Pulse: 70 72  Resp: 16 16  Temp: (!) 97.5 F (36.4 C) 98.9 F (37.2 C)  SpO2: 98% 96%   Vitals:   09/23/20 0425 09/23/20 0810 09/23/20 1132 09/23/20 1557  BP: 120/62 (!) 160/92 127/87 (!) 150/73  Pulse: 70 83 70 72  Resp: 16 17 16 16   Temp: 98.5 F (36.9 C) 98.5 F (36.9 C) (!) 97.5 F (36.4 C) 98.9 F (37.2 C)  TempSrc: Oral Oral Oral Oral  SpO2: 95% 98% 98% 96%  Weight: 62 kg     Height:        General: Pt is alert, awake, not in acute distress Cardiovascular: RRR, S1/S2 +, no rubs, no gallops Respiratory: CTA bilaterally, no wheezing, no rhonchi Abdominal: Soft, NT, ND, bowel sounds + Extremities: no edema, no cyanosis   The results of significant diagnostics from this hospitalization (including imaging, microbiology, ancillary and laboratory) are listed below for reference.    Microbiology: Recent Results (from the past 240 hour(s))  Respiratory Panel by RT PCR (Flu A&B, Covid) - Nasopharyngeal Swab     Status: None   Collection Time: 09/21/20  3:22 PM   Specimen: Nasopharyngeal Swab  Result Value Ref Range Status   SARS Coronavirus 2 by RT PCR NEGATIVE NEGATIVE Final    Comment: (NOTE) SARS-CoV-2 target nucleic acids are NOT DETECTED.  The SARS-CoV-2 RNA is generally detectable in upper respiratoy specimens during the acute phase of infection. The lowest concentration of SARS-CoV-2 viral copies this assay can detect is 131 copies/mL. A negative result does not preclude SARS-Cov-2 infection and should not be used as the sole basis for treatment or other patient management decisions. A negative result may occur with  improper specimen collection/handling, submission of  specimen other than nasopharyngeal swab, presence of viral mutation(s) within the areas targeted by this assay, and inadequate number of viral copies (<131 copies/mL). A negative result must be combined with clinical observations, patient history, and epidemiological information. The expected result is Negative.  Fact Sheet for Patients:  PinkCheek.be  Fact Sheet for Healthcare Providers:  GravelBags.it  This test is no t yet approved or cleared by the [place] FDA and  has been authorized for detection and/or diagnosis of SARS-CoV-2 by FDA under an Emergency Use Authorization (EUA). This EUA will remain  in effect (meaning this test can be used) for the duration of the COVID-19 declaration under Section 564(b)(1) of the Act, 21 U.S.C. section 360bbb-3(b)(1), unless the authorization is terminated or revoked sooner.     Influenza A by PCR NEGATIVE NEGATIVE Final   Influenza B by PCR NEGATIVE NEGATIVE Final    Comment: (NOTE) The Xpert Xpress SARS-CoV-2/FLU/RSV assay is intended as an aid in  the diagnosis of influenza from Nasopharyngeal swab specimens and  should not be used as a sole basis for treatment. Nasal washings and  aspirates are unacceptable for Xpert Xpress SARS-CoV-2/FLU/RSV  testing.  Fact Sheet for Patients: PinkCheek.be  Fact Sheet for Healthcare Providers: GravelBags.it  This test is not  yet approved or cleared by the [place] and  has been authorized for detection and/or diagnosis of SARS-CoV-2 by  FDA under an Emergency Use Authorization (EUA). This EUA will remain  in effect (meaning this test can be used) for the duration of the  Covid-19 declaration under Section 564(b)(1) of the Act, 21  U.S.C. section 360bbb-3(b)(1), unless the authorization is  terminated or revoked. Performed at [facility], [address] [place], [place] [address]      Labs: BNP (last 3 results) No results for input(s): BNP in the last 8760 hours. Basic Metabolic Panel: Recent Labs  Lab 09/21/20 1256 09/22/20 0203 09/23/20 0452  NA 133* 138 135  K 4.0 3.2* 3.7  CL 99 100 102  CO2 23 26 26   GLUCOSE 203* 109* 112*  BUN 16 16 25*  CREATININE 0.93 0.87 1.07*  CALCIUM 9.3 9.4 8.9  MG  --   --  1.7   Liver Function Tests: No results for input(s): AST, ALT, ALKPHOS, BILITOT, PROT, ALBUMIN in the last 168 hours. No results for input(s): LIPASE, AMYLASE in the last 168 hours. No results for input(s): AMMONIA in the last 168 hours. CBC: Recent Labs  Lab 09/21/20 1256 09/22/20 0203 09/23/20 0452  WBC 12.6* 12.2* 9.1  HGB 11.7* 11.8* 10.3*  HCT 34.5* 35.3* 30.2*  MCV 89.1 89.1 89.3  PLT 358 364 306   Cardiac Enzymes: No results for input(s): CKTOTAL, CKMB, CKMBINDEX, TROPONINI in the last 168 hours. BNP: Invalid input(s): POCBNP CBG: Recent Labs  Lab 09/21/20 1938 09/21/20 2203 09/22/20 0407 09/22/20 0746  GLUCAP 101* 101* 95 116*   D-Dimer No results for input(s): DDIMER in the last 72 hours. Hgb A1c Recent Labs    09/21/20 1256  HGBA1C 6.5*   Lipid Profile Recent Labs    09/22/20 0203  CHOL 178  HDL 41  LDLCALC 95  TRIG 208*  CHOLHDL 4.3   Thyroid function studies No results for input(s): TSH, T4TOTAL, T3FREE, THYROIDAB in the last 72 hours.  Invalid input(s): FREET3 Anemia work up No results for input(s): VITAMINB12, FOLATE, FERRITIN, TIBC, IRON, RETICCTPCT in the last 72 hours. Urinalysis    Component Value Date/Time   COLORURINE STRAW (A) 09/21/2020 1314   APPEARANCEUR CLEAR (A) 09/21/2020 1314   APPEARANCEUR Clear 04/25/2013 0945   LABSPEC 1.007 09/21/2020 1314   LABSPEC 1.010 04/25/2013 0945   PHURINE 6.0 09/21/2020 1314   GLUCOSEU >=500 (A) 09/21/2020 1314   GLUCOSEU 100 mg/dL 04/25/2013 0945   HGBUR NEGATIVE 09/21/2020 1314   BILIRUBINUR NEGATIVE 09/21/2020 1314    BILIRUBINUR Negative 04/25/2013 0945   KETONESUR NEGATIVE 09/21/2020 1314   PROTEINUR 100 (A) 09/21/2020 1314   NITRITE NEGATIVE 09/21/2020 1314   LEUKOCYTESUR NEGATIVE 09/21/2020 1314   LEUKOCYTESUR Negative 04/25/2013 0945   Sepsis Labs Invalid input(s): PROCALCITONIN,  WBC,  LACTICIDVEN Microbiology Recent Results (from the past 240 hour(s))  Respiratory Panel by RT PCR (Flu A&B, Covid) - Nasopharyngeal Swab     Status: None   Collection Time: 09/21/20  3:22 PM   Specimen: Nasopharyngeal Swab  Result Value Ref Range Status   SARS Coronavirus 2 by RT PCR NEGATIVE NEGATIVE Final    Comment: (NOTE) SARS-CoV-2 target nucleic acids are NOT DETECTED.  The SARS-CoV-2 RNA is generally detectable in upper respiratoy specimens during the acute phase of infection. The lowest concentration of SARS-CoV-2 viral copies this assay can detect is 131 copies/mL. A negative result does not preclude SARS-Cov-2 infection and  should not be used as the sole basis for treatment or other patient management decisions. A negative result may occur with  improper specimen collection/handling, submission of specimen other than nasopharyngeal swab, presence of viral mutation(s) within the areas targeted by this assay, and inadequate number of viral copies (<131 copies/mL). A negative result must be combined with clinical observations, patient history, and epidemiological information. The expected result is Negative.  Fact Sheet for Patients:  PinkCheek.be  Fact Sheet for Healthcare Providers:  GravelBags.it  This test is no t yet approved or cleared by the [place] FDA and  has been authorized for detection and/or diagnosis of SARS-CoV-2 by FDA under an Emergency Use Authorization (EUA). This EUA will remain  in effect (meaning this test can be used) for the duration of the COVID-19 declaration under Section 564(b)(1) of the Act, 21  U.S.C. section 360bbb-3(b)(1), unless the authorization is terminated or revoked sooner.     Influenza A by PCR NEGATIVE NEGATIVE Final   Influenza B by PCR NEGATIVE NEGATIVE Final    Comment: (NOTE) The Xpert Xpress SARS-CoV-2/FLU/RSV assay is intended as an aid in  the diagnosis of influenza from Nasopharyngeal swab specimens and  should not be used as a sole basis for treatment. Nasal washings and  aspirates are unacceptable for Xpert Xpress SARS-CoV-2/FLU/RSV  testing.  Fact Sheet for Patients: PinkCheek.be  Fact Sheet for Healthcare Providers: GravelBags.it  This test is not yet approved or cleared by the [place] FDA and  has been authorized for detection and/or diagnosis of SARS-CoV-2 by  FDA under an Emergency Use Authorization (EUA). This EUA will remain  in effect (meaning this test can be used) for the duration of the  Covid-19 declaration under Section 564(b)(1) of the Act, 21  U.S.C. section 360bbb-3(b)(1), unless the authorization is  terminated or revoked. Performed at [facility], [address], [place], [place] [address]     Time coordinating discharge: Over 30 minutes  SIGNED:  Lorella Nimrod, MD  [facility] 09/23/2020, 4:02 PM  If 7PM-7AM, please contact night-coverage [email or web address]  This record has been created using Systems analyst. Errors have been sought and corrected,but may not always be located. Such creation errors do not reflect on the standard of care.

## 2020-09-23 NOTE — Progress Notes (Signed)
[facility] Cardiology    SUBJECTIVE: The patient reports feeling well this morning with no complaints. She denies chest pain, shortness of breath, palpitations, dizziness, lightheadedness, or near syncope. She reports feeling at her baseline this morning.   Vitals:   09/22/20 1934 09/23/20 0049 09/23/20 0425 09/23/20 0810  BP: (!) 146/78 122/60 120/62 (!) 160/92  Pulse: 76 70 70 83  Resp:  18 16 17   Temp:  98.8 F (37.1 C) 98.5 F (36.9 C) 98.5 F (36.9 C)  TempSrc:  Oral Oral Oral  SpO2: 96% 97% 95% 98%  Weight:   62 kg   Height:         Intake/Output Summary (Last 24 hours) at 09/23/2020 7741 Last data filed at 09/22/2020 2253 Gross per 24 hour  Intake 600 ml  Output 300 ml  Net 300 ml      PHYSICAL EXAM  General: Well developed, well nourished, in no acute distress, lying in bed HEENT:  Normocephalic and atramatic Neck:  No JVD.  Lungs: normal effort of breathing on room air. Heart: HRRR . Normal S1 and S2 without gallops or murmurs.  Abdomen: nondistended Msk:  Back normal, gait not assessed. No obvious abnormalities Extremities: No clubbing, cyanosis or edema.   Neuro: Alert and oriented X 3. Psych:  Good affect, responds appropriately   LABS: Basic Metabolic Panel: Recent Labs    09/22/20 0203 09/23/20 0452  NA 138 135  K 3.2* 3.7  CL 100 102  CO2 26 26  GLUCOSE 109* 112*  BUN 16 25*  CREATININE 0.87 1.07*  CALCIUM 9.4 8.9  MG  --  1.7   Liver Function Tests: No results for input(s): AST, ALT, ALKPHOS, BILITOT, PROT, ALBUMIN in the last 72 hours. No results for input(s): LIPASE, AMYLASE in the last 72 hours. CBC: Recent Labs    09/22/20 0203 09/23/20 0452  WBC 12.2* 9.1  HGB 11.8* 10.3*  HCT 35.3* 30.2*  MCV 89.1 89.3  PLT 364 306   Cardiac Enzymes: No results for input(s): CKTOTAL, CKMB, CKMBINDEX, TROPONINI in the last 72 hours. BNP: Invalid input(s): POCBNP D-Dimer: No results for input(s): DDIMER in the last 72 hours. Hemoglobin  A1C: Recent Labs    09/21/20 1256  HGBA1C 6.5*   Fasting Lipid Panel: Recent Labs    09/22/20 0203  CHOL 178  HDL 41  LDLCALC 95  TRIG 208*  CHOLHDL 4.3   Thyroid Function Tests: No results for input(s): TSH, T4TOTAL, T3FREE, THYROIDAB in the last 72 hours.  Invalid input(s): FREET3 Anemia Panel: No results for input(s): VITAMINB12, FOLATE, FERRITIN, TIBC, IRON, RETICCTPCT in the last 72 hours.  CT Code Stroke CTA Head W/WO contrast  Result Date: 09/22/2020 CLINICAL DATA:  Stroke/TIA. Sudden onset of dizziness and lightheadedness yesterday. Symptoms lasted for 1 hour. Abnormal MRI. 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 EXAM: CT ANGIOGRAPHY HEAD AND NECK TECHNIQUE: Multidetector CT imaging of the head and neck was performed using the standard protocol during bolus administration of intravenous contrast. Multiplanar CT image reconstructions and MIPs were obtained to evaluate the vascular anatomy. Carotid stenosis measurements (when applicable) are obtained utilizing NASCET criteria, using the distal internal carotid diameter as the denominator. CONTRAST:  19mL OMNIPAQUE IOHEXOL 350 MG/ML SOLN COMPARISON:  MR head without contrast 09/21/2020. FINDINGS: Brain: Right cerebellar infarcts are less well seen than on the MRI. No new infarcts are present. No acute hemorrhage or mass lesion is present. The ventricles are of normal size. No significant extraaxial fluid collection  is present. Vascular: Extensive atherosclerotic calcifications are present within the cavernous internal carotid arteries bilaterally. No hyperdense vessel is present. Skull: Calvarium is intact. No focal lytic or blastic lesions are present. No significant extracranial soft tissue lesion is present. Sinuses: The paranasal sinuses and mastoid air cells are clear. Orbits: The globes and orbits are within normal limits. CTA NECK FINDINGS Aortic arch: Atherosclerotic calcifications are present within the  distal arch including the origin of the left subclavian artery. No significant stenosis is present. Common origin of the left common carotid artery and innominate artery is noted. Right carotid system: Right common carotid artery is within normal limits. The proximal right ICA is narrowed to 1 mm. More distal cervical right ICA is within normal limits. Left carotid system: Left common carotid artery demonstrates some mural calcification without significant stenosis. Atherosclerotic changes are present at the bifurcation. No significant stenosis is present relative to the more distal vessel. Atherosclerotic calcifications are present along the wall of distal right ICA, just below the skull base. Vertebral arteries: The left vertebral artery is dominant vessel. Both vertebral arteries originate from the subclavian arteries. Segmental narrowing is present in the distal right P2 segment. High-grade stenosis is present in the right vertebral artery at the dural margin. Skeleton: Anterior fusion is present at C6-7. Adjacent level degenerative changes are present C5-6 and to lesser extent at C4-5. Slight anterolisthesis present at C4-5. Straightening of the normal cervical lordosis is present. Other neck: The soft tissues the neck are otherwise unremarkable. Upper chest: Lung apices are clear. Review of the MIP images confirms the above findings CTA HEAD FINDINGS Anterior circulation: Atherosclerotic calcifications are present within the cavernous internal carotid arteries bilaterally. Mild narrowing of less than 50% is present bilaterally. ICA termini within normal limits. The A1 and M1 segments are normal. The anterior communicating artery is patent. MCA bifurcations are within normal limits. ACA and MCA branch vessels are within normal limits. Posterior circulation: Left vertebral artery is dominant vessel. Left PICA origin is visualized and normal. High-grade stenosis is present at the dural margin of the right  vertebral artery. The right V4 segment is occluded beyond the PICA. Basilar artery is normal. Both posterior cerebral arteries originate from the basilar tip. PCA branch vessels are within normal limits. Venous sinuses: Dural sinuses are patent. The straight sinus deep cerebral veins are intact. Cortical veins are within normal limits. Anatomic variants: None Review of the MIP images confirms the above findings IMPRESSION: 1. 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 2. High-grade, near occlusive stenosis of the proximal right internal carotid artery measuring up to 1 mm. 3. High-grade stenosis of the right vertebral artery at the dural margin. 4. Atherosclerotic changes at the left carotid bifurcation and cavernous internal carotid arteries bilaterally without significant stenosis relative to the more distal vessels. 5. No other significant proximal stenosis, aneurysm, or branch vessel occlusion within the Circle of Willis. 6. Multilevel spondylosis of the cervical spine. 7. Aortic Atherosclerosis (ICD10-I70.0). Electronically Signed   By: San Morelle M.D.   On: 09/22/2020 16:38   DG Chest 2 View  Result Date: 09/21/2020 CLINICAL DATA:  Dizziness. EXAM: CHEST - 2 VIEW COMPARISON:  Chest radiograph 07/14/2005. FINDINGS: The heart size and mediastinal contours are within normal limits. Both lungs are clear. The visualized skeletal structures are unremarkable. IMPRESSION: No active cardiopulmonary disease. Electronically Signed   By: Lovey Newcomer M.D.   On: 09/21/2020 14:14   CT Head Wo Contrast  Result Date: 09/21/2020  CLINICAL DATA:  Patient with dizziness. EXAM: CT HEAD WITHOUT CONTRAST TECHNIQUE: Contiguous axial images were obtained from the base of the skull through the vertex without intravenous contrast. COMPARISON:  None. FINDINGS: Brain: Ventricles and sulci are appropriate for patient's age. No evidence for acute cortically based infarct, intracranial hemorrhage, mass lesion or  mass-effect. Vascular: Unremarkable Skull: Intact. Sinuses/Orbits: Paranasal sinuses well aerated. Mastoid air cells unremarkable. Orbits unremarkable. Other: None. IMPRESSION: No acute intracranial process. Electronically Signed   By: Lovey Newcomer M.D.   On: 09/21/2020 14:25   CT Code Stroke CTA Neck W/WO contrast  Result Date: 09/22/2020 CLINICAL DATA:  Stroke/TIA. Sudden onset of dizziness and lightheadedness yesterday. Symptoms lasted for 1 hour. Abnormal MRI. 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 EXAM: CT ANGIOGRAPHY HEAD AND NECK TECHNIQUE: Multidetector CT imaging of the head and neck was performed using the standard protocol during bolus administration of intravenous contrast. Multiplanar CT image reconstructions and MIPs were obtained to evaluate the vascular anatomy. Carotid stenosis measurements (when applicable) are obtained utilizing NASCET criteria, using the distal internal carotid diameter as the denominator. CONTRAST:  68mL OMNIPAQUE IOHEXOL 350 MG/ML SOLN COMPARISON:  MR head without contrast 09/21/2020. FINDINGS: Brain: Right cerebellar infarcts are less well seen than on the MRI. No new infarcts are present. No acute hemorrhage or mass lesion is present. The ventricles are of normal size. No significant extraaxial fluid collection is present. Vascular: Extensive atherosclerotic calcifications are present within the cavernous internal carotid arteries bilaterally. No hyperdense vessel is present. Skull: Calvarium is intact. No focal lytic or blastic lesions are present. No significant extracranial soft tissue lesion is present. Sinuses: The paranasal sinuses and mastoid air cells are clear. Orbits: The globes and orbits are within normal limits. CTA NECK FINDINGS Aortic arch: Atherosclerotic calcifications are present within the distal arch including the origin of the left subclavian artery. No significant stenosis is present. Common origin of the left common carotid artery  and innominate artery is noted. Right carotid system: Right common carotid artery is within normal limits. The proximal right ICA is narrowed to 1 mm. More distal cervical right ICA is within normal limits. Left carotid system: Left common carotid artery demonstrates some mural calcification without significant stenosis. Atherosclerotic changes are present at the bifurcation. No significant stenosis is present relative to the more distal vessel. Atherosclerotic calcifications are present along the wall of distal right ICA, just below the skull base. Vertebral arteries: The left vertebral artery is dominant vessel. Both vertebral arteries originate from the subclavian arteries. Segmental narrowing is present in the distal right P2 segment. High-grade stenosis is present in the right vertebral artery at the dural margin. Skeleton: Anterior fusion is present at C6-7. Adjacent level degenerative changes are present C5-6 and to lesser extent at C4-5. Slight anterolisthesis present at C4-5. Straightening of the normal cervical lordosis is present. Other neck: The soft tissues the neck are otherwise unremarkable. Upper chest: Lung apices are clear. Review of the MIP images confirms the above findings CTA HEAD FINDINGS Anterior circulation: Atherosclerotic calcifications are present within the cavernous internal carotid arteries bilaterally. Mild narrowing of less than 50% is present bilaterally. ICA termini within normal limits. The A1 and M1 segments are normal. The anterior communicating artery is patent. MCA bifurcations are within normal limits. ACA and MCA branch vessels are within normal limits. Posterior circulation: Left vertebral artery is dominant vessel. Left PICA origin is visualized and normal. High-grade stenosis is present at the dural margin of the right  vertebral artery. The right V4 segment is occluded beyond the PICA. Basilar artery is normal. Both posterior cerebral arteries originate from the basilar  tip. PCA branch vessels are within normal limits. Venous sinuses: Dural sinuses are patent. The straight sinus deep cerebral veins are intact. Cortical veins are within normal limits. Anatomic variants: None Review of the MIP images confirms the above findings IMPRESSION: 1. 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 2. High-grade, near occlusive stenosis of the proximal right internal carotid artery measuring up to 1 mm. 3. High-grade stenosis of the right vertebral artery at the dural margin. 4. Atherosclerotic changes at the left carotid bifurcation and cavernous internal carotid arteries bilaterally without significant stenosis relative to the more distal vessels. 5. No other significant proximal stenosis, aneurysm, or branch vessel occlusion within the Circle of Willis. 6. Multilevel spondylosis of the cervical spine. 7. Aortic Atherosclerosis (ICD10-I70.0). Electronically Signed   By: San Morelle M.D.   On: 09/22/2020 16:38   CT ANGIO CHEST PE W OR WO CONTRAST  Result Date: 09/21/2020 CLINICAL DATA:  Syncope, dizziness after cooking breakfast, lightheadedness EXAM: CT ANGIOGRAPHY CHEST WITH CONTRAST TECHNIQUE: Multidetector CT imaging of the chest was performed using the standard protocol during bolus administration of intravenous contrast. Multiplanar CT image reconstructions and MIPs were obtained to evaluate the vascular anatomy. CONTRAST:  15mL OMNIPAQUE IOHEXOL 350 MG/ML SOLN COMPARISON:  CT chest 08/26/2006 FINDINGS: Cardiovascular: Satisfactory opacification the pulmonary arteries to the segmental level. No pulmonary artery filling defects are identified. Central pulmonary arteries are normal caliber. Normal heart size. No pericardial effusion. Three-vessel coronary artery atherosclerosis. Suboptimal opacification of the aorta. Atherosclerotic plaque within the normal caliber aorta. No gross luminal abnormality. No periaortic stranding or hemorrhage. Shared origin of the  brachiocephalic and left common carotid arteries. Minimal plaque in the otherwise unremarkable proximal great vessels. Mediastinum/Nodes: No mediastinal fluid or gas. Normal thyroid gland and thoracic inlet. No acute abnormality of the trachea or esophagus. No worrisome mediastinal, hilar or axillary adenopathy. Lungs/Pleura: Dependent atelectatic changes. No consolidation, features of edema, pneumothorax, or effusion. Tiny 3 mm juxtapleural nodule along the right hemidiaphragm (6/50, 7/36), possibly subpleural lymph node. No other concerning nodules or masses. Mild biapical pleuroparenchymal scarring. Upper Abdomen: No acute abnormalities present in the visualized portions of the upper abdomen. Punctate calcification in the liver, may reflect remote sequela of prior granulomatous disease. Musculoskeletal: No acute osseous abnormality or suspicious osseous lesion. Multilevel degenerative changes are present in the imaged portions of the spine. Additional degenerative changes in the shoulders. No worrisome chest wall lesions. Review of the MIP images confirms the above findings. IMPRESSION: 1. No evidence of pulmonary embolism. 2. No acute intrathoracic process. 3. Three-vessel coronary artery atherosclerosis. 4. Aortic Atherosclerosis (ICD10-I70.0). Electronically Signed   By: Lovena Le M.D.   On: 09/21/2020 16:50   MR BRAIN WO CONTRAST  Result Date: 09/21/2020 CLINICAL DATA:  Initial evaluation for acute syncope. EXAM: MRI HEAD WITHOUT CONTRAST TECHNIQUE: Multiplanar, multiecho pulse sequences of the brain and surrounding structures were obtained without intravenous contrast. COMPARISON:  Prior CT from 09/21/2020. FINDINGS: Brain: Cerebral volume within normal limits for age. No significant cerebral white matter disease. 12 mm remote lacunar infarct noted involving the ventral left paramedian pons (series 16, image 55). Patchy small volume diffusion signal abnormality seen involving the right cerebellum,  consistent with acute to early subacute ischemic infarcts (series 5, images 12, 11). The largest of these foci measures 5 mm. No associated hemorrhage or mass effect. No  other diffusion abnormality to suggest acute or subacute ischemia. Gray-white matter differentiation otherwise maintained. No other areas of encephalomalacia to suggest chronic cortical infarction. No foci of susceptibility artifact to suggest acute or chronic intracranial hemorrhage. No mass lesion, midline shift or mass effect. No hydrocephalus or extra-axial fluid collection. Pituitary gland and suprasellar region within normal limits. Midline structures intact. Vascular: Heterogeneous flow void within the right vertebral artery, which could be related to slow flow and/or occlusion (series 10, image 3). Major intracranial vascular flow voids otherwise maintained. Skull and upper cervical spine: Craniocervical junction within normal limits. Bone marrow signal intensity normal. No scalp soft tissue abnormality. Sinuses/Orbits: Globes and orbital soft tissues within normal limits. Scattered mucosal thickening noted throughout the ethmoidal air cells and maxillary sinuses. No mastoid effusion. Inner ear structures grossly normal. Other: None. IMPRESSION: 1. Patchy small volume acute to early subacute ischemic infarcts involving the right cerebellum. No associated hemorrhage or mass effect. 2. 12 mm remote lacunar infarct involving the ventral left paramedian pons. 3. Heterogeneous flow void within the right vertebral artery, which could be related to slow flow and/or occlusion. 4. Otherwise normal brain MRI for age. Electronically Signed   By: Jeannine Boga M.D.   On: 09/21/2020 22:21   US Venous Img Lower Bilateral (DVT)  Result Date: 09/21/2020 CLINICAL DATA:  Pain, positive D-dimer EXAM: BILATERAL LOWER EXTREMITY VENOUS DOPPLER ULTRASOUND TECHNIQUE: Gray-scale sonography with compression, as well as color and duplex ultrasound, were  performed to evaluate the deep venous system(s) from the level of the common femoral vein through the popliteal and proximal calf veins. COMPARISON:  None. FINDINGS: VENOUS Normal compressibility of the common femoral, superficial femoral, and popliteal veins, as well as the visualized calf veins. Visualized portions of profunda femoral vein and great saphenous vein unremarkable. No filling defects to suggest DVT on grayscale or color Doppler imaging. Doppler waveforms show normal direction of venous flow, normal respiratory plasticity and response to augmentation. Limited views of the contralateral common femoral vein are unremarkable. OTHER None. Limitations: none IMPRESSION: No acute DVT. Electronically Signed   By: Valentino Saxon MD   On: 09/21/2020 17:17   ECHOCARDIOGRAM COMPLETE  Result Date: 09/22/2020    ECHOCARDIOGRAM REPORT   Patient Name:   Latoya Herman Date of Exam: 09/22/2020 Medical Rec #:  [id number]       Height:       65.0 in Accession #:    [id number]      Weight:       137.1 lb Date of Birth:  1951-11-14        BSA:          1.685 m Patient Age:    69 years        BP:           141/75 mmHg Patient Gender: F               HR:           79 bpm. Exam Location:  [facility] Procedure: 2D Echo, Color Doppler and Cardiac Doppler Indications:     R55 Syncope  History:         Patient has no prior history of Echocardiogram examinations.                  CAD, CKD; Risk Factors:Hypertension, Diabetes and Dyslipidemia.  Sonographer:     Charmayne Sheer RDCS (AE) Referring Phys:  [id number] Lorenza Chick Diagnosing Phys: Isaias Cowman MD IMPRESSIONS  1.  Left ventricular ejection fraction, by estimation, is 55 to 60%. The left ventricle has normal function. The left ventricle has no regional wall motion abnormalities. Left ventricular diastolic parameters are consistent with Grade I diastolic dysfunction (impaired relaxation).  2. Right ventricular systolic function is normal. The right ventricular size is  normal.  3. The mitral valve is normal in structure. Trivial mitral valve regurgitation. No evidence of mitral stenosis.  4. The aortic valve is normal in structure. Aortic valve regurgitation is mild. No aortic stenosis is present.  5. The inferior vena cava is normal in size with greater than 50% respiratory variability, suggesting right atrial pressure of 3 mmHg. FINDINGS  Left Ventricle: Left ventricular ejection fraction, by estimation, is 55 to 60%. The left ventricle has normal function. The left ventricle has no regional wall motion abnormalities. The left ventricular internal cavity size was normal in size. There is  no left ventricular hypertrophy. Left ventricular diastolic parameters are consistent with Grade I diastolic dysfunction (impaired relaxation). Right Ventricle: The right ventricular size is normal. No increase in right ventricular wall thickness. Right ventricular systolic function is normal. Left Atrium: Left atrial size was normal in size. Right Atrium: Right atrial size was normal in size. Pericardium: There is no evidence of pericardial effusion. Mitral Valve: The mitral valve is normal in structure. Trivial mitral valve regurgitation. No evidence of mitral valve stenosis. MV peak gradient, 4.2 mmHg. The mean mitral valve gradient is 1.0 mmHg. Tricuspid Valve: The tricuspid valve is normal in structure. Tricuspid valve regurgitation is trivial. No evidence of tricuspid stenosis. Aortic Valve: The aortic valve is normal in structure. Aortic valve regurgitation is mild. No aortic stenosis is present. Aortic valve mean gradient measures 4.0 mmHg. Aortic valve peak gradient measures 7.2 mmHg. Aortic valve area, by VTI measures 1.47 cm. Pulmonic Valve: The pulmonic valve was normal in structure. Pulmonic valve regurgitation is not visualized. No evidence of pulmonic stenosis. Aorta: The aortic root is normal in size and structure. Venous: The inferior vena cava is normal in size with greater  than 50% respiratory variability, suggesting right atrial pressure of 3 mmHg. IAS/Shunts: No atrial level shunt detected by color flow Doppler.  LEFT VENTRICLE PLAX 2D LVIDd:         4.00 cm  Diastology LVIDs:         2.67 cm  LV e' medial:    4.46 cm/s LV PW:         0.87 cm  LV E/e' medial:  15.7 LV IVS:        0.93 cm  LV e' lateral:   4.57 cm/s LVOT diam:     1.60 cm  LV E/e' lateral: 15.3 LV SV:         37 LV SV Index:   22 LVOT Area:     2.01 cm  RIGHT VENTRICLE RV Basal diam:  2.50 cm LEFT ATRIUM             Index       RIGHT ATRIUM          Index LA diam:        3.60 cm 2.14 cm/m  RA Area:     9.70 cm LA Vol (A2C):   20.4 ml 12.11 ml/m RA Volume:   19.70 ml 11.69 ml/m LA Vol (A4C):   33.8 ml 20.06 ml/m LA Biplane Vol: 28.2 ml 16.74 ml/m  AORTIC VALVE  PULMONIC VALVE AV Area (Vmax):    1.56 cm    PV Vmax:       1.03 m/s AV Area (Vmean):   1.43 cm    PV Vmean:      73.600 cm/s AV Area (VTI):     1.47 cm    PV VTI:        0.219 m AV Vmax:           134.00 cm/s PV Peak grad:  4.2 mmHg AV Vmean:          97.900 cm/s PV Mean grad:  2.0 mmHg AV VTI:            0.250 m AV Peak Grad:      7.2 mmHg AV Mean Grad:      4.0 mmHg LVOT Vmax:         104.00 cm/s LVOT Vmean:        69.500 cm/s LVOT VTI:          0.183 m LVOT/AV VTI ratio: 0.73  AORTA Ao Root diam: 2.80 cm MITRAL VALVE MV Area (PHT): 3.95 cm    SHUNTS MV Peak grad:  4.2 mmHg    Systemic VTI:  0.18 m MV Mean grad:  1.0 mmHg    Systemic Diam: 1.60 cm MV Vmax:       1.03 m/s MV Vmean:      52.9 cm/s MV Decel Time: 192 msec MV E velocity: 69.80 cm/s MV A velocity: 93.40 cm/s MV E/A ratio:  0.75 Isaias Cowman MD Electronically signed by Isaias Cowman MD Signature Date/Time: 09/22/2020/2:52:59 PM    Final      Echo LVEF 55-60% with no wall motion abnormalities, no cardiac source of emboli visualized  TELEMETRY: sinus  ASSESSMENT AND PLAN:  Principal Problem:   Near syncope Active Problems:   Coronary artery  disease   Hypertension   Hyperlipidemia   Diabetes mellitus without complication (HCC)   Elevated troponin   Leukocytosis   Hypothyroidism   Acute ischemic stroke ([place])    1. Near syncope, likely secondary to acute cerebellar stroke. 2D echocardiogram reveals normal left ventricular function with no wall motion abnormalities without cardiac source of emboli 2. Cerebellar stroke 3. Coronary artery disease, stent placement in 2014, denies chest pain 4. Elevated troponin, (87, 385, 849, 813, followed by 400) unlikely ACS in the absence of chest pain or ECG changes, in the setting of stroke 5. Hypertension 6. Hyperlipidemia, LDL above goal on high intensity atorvastatin   Recommendations: 1. Continue Plavix and aspirin 2. Continue high intensity statin 3. Continue carvedilol and spironolactone with goal BP 130-140/70-80 4. Consider adding Zetia 5. Consider Holter at discharge or in follow-up with Dr. Nehemiah Massed 6. Await neurology for further recommendations 7. Defer further cardiac diagnostics at this time.   Will sign off for now; please call or [facility] with any questions or needs.  Clabe Seal, PA-C 09/23/2020 8:42 AM

## 2020-09-30 DIAGNOSIS — I69993 Ataxia following unspecified cerebrovascular disease: Secondary | ICD-10-CM | POA: Insufficient documentation

## 2020-10-02 ENCOUNTER — Ambulatory Visit (INDEPENDENT_AMBULATORY_CARE_PROVIDER_SITE_OTHER): Payer: Self-pay | Admitting: Nurse Practitioner

## 2020-10-07 ENCOUNTER — Ambulatory Visit (INDEPENDENT_AMBULATORY_CARE_PROVIDER_SITE_OTHER): Payer: Medicare HMO | Admitting: Vascular Surgery

## 2020-10-07 ENCOUNTER — Encounter (INDEPENDENT_AMBULATORY_CARE_PROVIDER_SITE_OTHER): Payer: Self-pay | Admitting: Vascular Surgery

## 2020-10-07 ENCOUNTER — Other Ambulatory Visit: Payer: Self-pay

## 2020-10-07 VITALS — BP 166/75 | HR 76 | Ht 65.0 in | Wt 136.0 lb

## 2020-10-07 DIAGNOSIS — I6521 Occlusion and stenosis of right carotid artery: Secondary | ICD-10-CM | POA: Diagnosis not present

## 2020-10-07 DIAGNOSIS — E785 Hyperlipidemia, unspecified: Secondary | ICD-10-CM

## 2020-10-07 DIAGNOSIS — I639 Cerebral infarction, unspecified: Secondary | ICD-10-CM

## 2020-10-07 DIAGNOSIS — I10 Essential (primary) hypertension: Secondary | ICD-10-CM | POA: Diagnosis not present

## 2020-10-07 DIAGNOSIS — I25118 Atherosclerotic heart disease of native coronary artery with other forms of angina pectoris: Secondary | ICD-10-CM | POA: Diagnosis not present

## 2020-10-07 DIAGNOSIS — I6529 Occlusion and stenosis of unspecified carotid artery: Secondary | ICD-10-CM | POA: Insufficient documentation

## 2020-10-07 NOTE — H&P (View-Only) (Signed)
MRN : [id number]  Latoya Herman is a 69 y.o. (06/09/1951) female who presents with chief complaint of  History of Present Illness:   The patient is seen for evaluation of carotid stenosis. The carotid stenosis was identified after CT angiogram was ordered as workup for a CVA.  The patient denies interval amaurosis fugax. There is no recent/interval  history of TIA symptoms or focal motor deficits. There is a prior documented CVA.  There is no history of migraine headaches. There is no history of seizures.  The patient is taking enteric-coated aspirin 81 mg daily.  The patient has a history of coronary artery disease, no recent episodes of angina or shortness of breath. The patient denies PAD or claudication symptoms. There is a history of hyperlipidemia which is being treated with a statin.   CT angiogram of the Neck and Head is reviewed by me personally and with the patient.  It shows >40% RICA and 98% LICA with wide patency of the left vertebral and occlusion of the distal right vertebral   No outpatient medications have been marked as taking for the 10/07/20 encounter (Appointment) with Delana Meyer, Dolores Lory, MD.    Past Medical History:  Diagnosis Date  . Chronic kidney disease   . Coronary artery disease   . Diabetes mellitus without complication ([place])   . Hyperlipidemia   . Hypertension   . SOBOE (shortness of breath on exertion)     Past Surgical History:  Procedure Laterality Date  . COLONOSCOPY  04/23/2010  . COLONOSCOPY WITH ESOPHAGOGASTRODUODENOSCOPY (EGD)  04/23/2010  . COLONOSCOPY WITH PROPOFOL N/A 08/23/2020   Procedure: COLONOSCOPY WITH PROPOFOL;  Surgeon: Lesly Rubenstein, MD;  Location: [facility] ENDOSCOPY;  Service: Endoscopy;  Laterality: N/A;  . CORONARY ANGIOPLASTY WITH STENT PLACEMENT  10/19/2013  . SUPERFICIAL LYMPH NODE BIOPSY / EXCISION  2013    Social History Social History   Tobacco Use  . Smoking status: Never Smoker  . Smokeless tobacco:  Never Used  Substance Use Topics  . Alcohol use: Never  . Drug use: Never    Family History Family History  Problem Relation Age of Onset  . Breast cancer Neg Hx   No family history of bleeding/clotting disorders, porphyria or autoimmune disease   Allergies  Allergen Reactions  . Penicillins Swelling and Other (See Comments)  . Lisinopril Rash    Possible rash Possible rash      REVIEW OF SYSTEMS (Negative unless checked)  Constitutional: [] Weight loss  [] Fever  [] Chills Cardiac: [x] Chest pain   [] Chest pressure   [] Palpitations   [] Shortness of breath when laying flat   [] Shortness of breath with exertion. Vascular:  [] Pain in legs with walking   [] Pain in legs at rest  [] History of DVT   [] Phlebitis   [] Swelling in legs   [] Varicose veins   [] Non-healing ulcers Pulmonary:   [] Uses home oxygen   [] Productive cough   [] Hemoptysis   [] Wheeze  [] COPD   [] Asthma Neurologic:  [x] Dizziness   [] Seizures   [x] History of stroke   [x] History of TIA  [] Aphasia   [] Vissual changes   [] Weakness or numbness in arm   [] Weakness or numbness in leg Musculoskeletal:   [] Joint swelling   [] Joint pain   [] Low back pain Hematologic:  [] Easy bruising  [] Easy bleeding   [] Hypercoagulable state   [] Anemic Gastrointestinal:  [] Diarrhea   [] Vomiting  [] Gastroesophageal reflux/heartburn   [] Difficulty swallowing. Genitourinary:  [] Chronic kidney disease   [] Difficult urination  [] Frequent urination   []   Blood in urine Skin:  [] Rashes   [] Ulcers  Psychological:  [] History of anxiety   []  History of major depression.  Physical Examination  There were no vitals filed for this visit. There is no height or weight on file to calculate BMI. Gen: WD/WN, NAD Head: [place]/AT, No temporalis wasting.  Ear/Nose/Throat: Hearing grossly intact, nares w/o erythema or drainage, poor dentition Eyes: PER, EOMI, sclera nonicteric.  Neck: Supple, no masses.  No bruit or JVD.  Pulmonary:  Good air movement, clear to  auscultation bilaterally, no use of accessory muscles.  Cardiac: RRR, normal S1, S2, no Murmurs. Vascular:  Right carotid bruit noted Vessel Right Left  Radial Palpable Palpable  Carotid Palpable Palpable  Gastrointestinal: soft, non-distended. No guarding/no peritoneal signs.  Musculoskeletal: M/S 5/5 throughout.  No deformity or atrophy.  Neurologic: CN 2-12 intact. Pain and light touch intact in extremities.  Symmetrical.  Speech is fluent. Motor exam as listed above. Psychiatric: Judgment intact, Mood & affect appropriate for pt's clinical situation. Dermatologic: No rashes or ulcers noted.  No changes consistent with cellulitis.   CBC Lab Results  Component Value Date   WBC 9.1 09/23/2020   HGB 10.3 (L) 09/23/2020   HCT 30.2 (L) 09/23/2020   MCV 89.3 09/23/2020   PLT 306 09/23/2020    BMET    Component Value Date/Time   NA 135 09/23/2020 0452   NA 134 (L) 10/20/2013 0130   K 3.7 09/23/2020 0452   K 4.1 10/20/2013 0130   CL 102 09/23/2020 0452   CL 102 10/20/2013 0130   CO2 26 09/23/2020 0452   CO2 25 10/20/2013 0130   GLUCOSE 112 (H) 09/23/2020 0452   GLUCOSE 238 (H) 10/20/2013 0130   BUN 25 (H) 09/23/2020 0452   BUN 14 10/20/2013 0130   CREATININE 1.07 (H) 09/23/2020 0452   CREATININE 0.91 10/20/2013 0130   CALCIUM 8.9 09/23/2020 0452   CALCIUM 8.6 10/20/2013 0130   GFRNONAA 52 (L) 09/23/2020 0452   GFRNONAA >60 10/20/2013 0130   GFRAA >60 10/20/2013 0130   CrCl cannot be calculated (Unknown ideal weight.).  COAG Lab Results  Component Value Date   INR 0.9 09/22/2020    Radiology CT Code Stroke CTA Head W/WO contrast  Result Date: 09/22/2020 CLINICAL DATA:  Stroke/TIA. Sudden onset of dizziness and lightheadedness yesterday. Symptoms lasted for 1 hour. Abnormal MRI. 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 EXAM: CT ANGIOGRAPHY HEAD AND NECK TECHNIQUE: Multidetector CT imaging of the head and neck was performed using the standard  protocol during bolus administration of intravenous contrast. Multiplanar CT image reconstructions and MIPs were obtained to evaluate the vascular anatomy. Carotid stenosis measurements (when applicable) are obtained utilizing NASCET criteria, using the distal internal carotid diameter as the denominator. CONTRAST:  4mL OMNIPAQUE IOHEXOL 350 MG/ML SOLN COMPARISON:  MR head without contrast 09/21/2020. FINDINGS: Brain: Right cerebellar infarcts are less well seen than on the MRI. No new infarcts are present. No acute hemorrhage or mass lesion is present. The ventricles are of normal size. No significant extraaxial fluid collection is present. Vascular: Extensive atherosclerotic calcifications are present within the cavernous internal carotid arteries bilaterally. No hyperdense vessel is present. Skull: Calvarium is intact. No focal lytic or blastic lesions are present. No significant extracranial soft tissue lesion is present. Sinuses: The paranasal sinuses and mastoid air cells are clear. Orbits: The globes and orbits are within normal limits. CTA NECK FINDINGS Aortic arch: Atherosclerotic calcifications are present within the distal arch including the  origin of the left subclavian artery. No significant stenosis is present. Common origin of the left common carotid artery and innominate artery is noted. Right carotid system: Right common carotid artery is within normal limits. The proximal right ICA is narrowed to 1 mm. More distal cervical right ICA is within normal limits. Left carotid system: Left common carotid artery demonstrates some mural calcification without significant stenosis. Atherosclerotic changes are present at the bifurcation. No significant stenosis is present relative to the more distal vessel. Atherosclerotic calcifications are present along the wall of distal right ICA, just below the skull base. Vertebral arteries: The left vertebral artery is dominant vessel. Both vertebral arteries originate  from the subclavian arteries. Segmental narrowing is present in the distal right P2 segment. High-grade stenosis is present in the right vertebral artery at the dural margin. Skeleton: Anterior fusion is present at C6-7. Adjacent level degenerative changes are present C5-6 and to lesser extent at C4-5. Slight anterolisthesis present at C4-5. Straightening of the normal cervical lordosis is present. Other neck: The soft tissues the neck are otherwise unremarkable. Upper chest: Lung apices are clear. Review of the MIP images confirms the above findings CTA HEAD FINDINGS Anterior circulation: Atherosclerotic calcifications are present within the cavernous internal carotid arteries bilaterally. Mild narrowing of less than 50% is present bilaterally. ICA termini within normal limits. The A1 and M1 segments are normal. The anterior communicating artery is patent. MCA bifurcations are within normal limits. ACA and MCA branch vessels are within normal limits. Posterior circulation: Left vertebral artery is dominant vessel. Left PICA origin is visualized and normal. High-grade stenosis is present at the dural margin of the right vertebral artery. The right V4 segment is occluded beyond the PICA. Basilar artery is normal. Both posterior cerebral arteries originate from the basilar tip. PCA branch vessels are within normal limits. Venous sinuses: Dural sinuses are patent. The straight sinus deep cerebral veins are intact. Cortical veins are within normal limits. Anatomic variants: None Review of the MIP images confirms the above findings IMPRESSION: 1. 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 2. High-grade, near occlusive stenosis of the proximal right internal carotid artery measuring up to 1 mm. 3. High-grade stenosis of the right vertebral artery at the dural margin. 4. Atherosclerotic changes at the left carotid bifurcation and cavernous internal carotid arteries bilaterally without significant stenosis relative  to the more distal vessels. 5. No other significant proximal stenosis, aneurysm, or branch vessel occlusion within the Circle of Willis. 6. Multilevel spondylosis of the cervical spine. 7. Aortic Atherosclerosis (ICD10-I70.0). Electronically Signed   By: San Morelle M.D.   On: 09/22/2020 16:38   DG Chest 2 View  Result Date: 09/21/2020 CLINICAL DATA:  Dizziness. EXAM: CHEST - 2 VIEW COMPARISON:  Chest radiograph 07/14/2005. FINDINGS: The heart size and mediastinal contours are within normal limits. Both lungs are clear. The visualized skeletal structures are unremarkable. IMPRESSION: No active cardiopulmonary disease. Electronically Signed   By: Lovey Newcomer M.D.   On: 09/21/2020 14:14   CT Head Wo Contrast  Result Date: 09/21/2020 CLINICAL DATA:  Patient with dizziness. EXAM: CT HEAD WITHOUT CONTRAST TECHNIQUE: Contiguous axial images were obtained from the base of the skull through the vertex without intravenous contrast. COMPARISON:  None. FINDINGS: Brain: Ventricles and sulci are appropriate for patient's age. No evidence for acute cortically based infarct, intracranial hemorrhage, mass lesion or mass-effect. Vascular: Unremarkable Skull: Intact. Sinuses/Orbits: Paranasal sinuses well aerated. Mastoid air cells unremarkable. Orbits unremarkable. Other: None. IMPRESSION: No  acute intracranial process. Electronically Signed   By: Lovey Newcomer M.D.   On: 09/21/2020 14:25   CT Code Stroke CTA Neck W/WO contrast  Result Date: 09/22/2020 CLINICAL DATA:  Stroke/TIA. Sudden onset of dizziness and lightheadedness yesterday. Symptoms lasted for 1 hour. Abnormal MRI. 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 EXAM: CT ANGIOGRAPHY HEAD AND NECK TECHNIQUE: Multidetector CT imaging of the head and neck was performed using the standard protocol during bolus administration of intravenous contrast. Multiplanar CT image reconstructions and MIPs were obtained to evaluate the vascular anatomy.  Carotid stenosis measurements (when applicable) are obtained utilizing NASCET criteria, using the distal internal carotid diameter as the denominator. CONTRAST:  75mL OMNIPAQUE IOHEXOL 350 MG/ML SOLN COMPARISON:  MR head without contrast 09/21/2020. FINDINGS: Brain: Right cerebellar infarcts are less well seen than on the MRI. No new infarcts are present. No acute hemorrhage or mass lesion is present. The ventricles are of normal size. No significant extraaxial fluid collection is present. Vascular: Extensive atherosclerotic calcifications are present within the cavernous internal carotid arteries bilaterally. No hyperdense vessel is present. Skull: Calvarium is intact. No focal lytic or blastic lesions are present. No significant extracranial soft tissue lesion is present. Sinuses: The paranasal sinuses and mastoid air cells are clear. Orbits: The globes and orbits are within normal limits. CTA NECK FINDINGS Aortic arch: Atherosclerotic calcifications are present within the distal arch including the origin of the left subclavian artery. No significant stenosis is present. Common origin of the left common carotid artery and innominate artery is noted. Right carotid system: Right common carotid artery is within normal limits. The proximal right ICA is narrowed to 1 mm. More distal cervical right ICA is within normal limits. Left carotid system: Left common carotid artery demonstrates some mural calcification without significant stenosis. Atherosclerotic changes are present at the bifurcation. No significant stenosis is present relative to the more distal vessel. Atherosclerotic calcifications are present along the wall of distal right ICA, just below the skull base. Vertebral arteries: The left vertebral artery is dominant vessel. Both vertebral arteries originate from the subclavian arteries. Segmental narrowing is present in the distal right P2 segment. High-grade stenosis is present in the right vertebral artery  at the dural margin. Skeleton: Anterior fusion is present at C6-7. Adjacent level degenerative changes are present C5-6 and to lesser extent at C4-5. Slight anterolisthesis present at C4-5. Straightening of the normal cervical lordosis is present. Other neck: The soft tissues the neck are otherwise unremarkable. Upper chest: Lung apices are clear. Review of the MIP images confirms the above findings CTA HEAD FINDINGS Anterior circulation: Atherosclerotic calcifications are present within the cavernous internal carotid arteries bilaterally. Mild narrowing of less than 50% is present bilaterally. ICA termini within normal limits. The A1 and M1 segments are normal. The anterior communicating artery is patent. MCA bifurcations are within normal limits. ACA and MCA branch vessels are within normal limits. Posterior circulation: Left vertebral artery is dominant vessel. Left PICA origin is visualized and normal. High-grade stenosis is present at the dural margin of the right vertebral artery. The right V4 segment is occluded beyond the PICA. Basilar artery is normal. Both posterior cerebral arteries originate from the basilar tip. PCA branch vessels are within normal limits. Venous sinuses: Dural sinuses are patent. The straight sinus deep cerebral veins are intact. Cortical veins are within normal limits. Anatomic variants: None Review of the MIP images confirms the above findings IMPRESSION: 1. 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  2. High-grade, near occlusive stenosis of the proximal right internal carotid artery measuring up to 1 mm. 3. High-grade stenosis of the right vertebral artery at the dural margin. 4. Atherosclerotic changes at the left carotid bifurcation and cavernous internal carotid arteries bilaterally without significant stenosis relative to the more distal vessels. 5. No other significant proximal stenosis, aneurysm, or branch vessel occlusion within the Circle of Willis. 6. Multilevel  spondylosis of the cervical spine. 7. Aortic Atherosclerosis (ICD10-I70.0). Electronically Signed   By: San Morelle M.D.   On: 09/22/2020 16:38   CT ANGIO CHEST PE W OR WO CONTRAST  Result Date: 09/21/2020 CLINICAL DATA:  Syncope, dizziness after cooking breakfast, lightheadedness EXAM: CT ANGIOGRAPHY CHEST WITH CONTRAST TECHNIQUE: Multidetector CT imaging of the chest was performed using the standard protocol during bolus administration of intravenous contrast. Multiplanar CT image reconstructions and MIPs were obtained to evaluate the vascular anatomy. CONTRAST:  84mL OMNIPAQUE IOHEXOL 350 MG/ML SOLN COMPARISON:  CT chest 08/26/2006 FINDINGS: Cardiovascular: Satisfactory opacification the pulmonary arteries to the segmental level. No pulmonary artery filling defects are identified. Central pulmonary arteries are normal caliber. Normal heart size. No pericardial effusion. Three-vessel coronary artery atherosclerosis. Suboptimal opacification of the aorta. Atherosclerotic plaque within the normal caliber aorta. No gross luminal abnormality. No periaortic stranding or hemorrhage. Shared origin of the brachiocephalic and left common carotid arteries. Minimal plaque in the otherwise unremarkable proximal great vessels. Mediastinum/Nodes: No mediastinal fluid or gas. Normal thyroid gland and thoracic inlet. No acute abnormality of the trachea or esophagus. No worrisome mediastinal, hilar or axillary adenopathy. Lungs/Pleura: Dependent atelectatic changes. No consolidation, features of edema, pneumothorax, or effusion. Tiny 3 mm juxtapleural nodule along the right hemidiaphragm (6/50, 7/36), possibly subpleural lymph node. No other concerning nodules or masses. Mild biapical pleuroparenchymal scarring. Upper Abdomen: No acute abnormalities present in the visualized portions of the upper abdomen. Punctate calcification in the liver, may reflect remote sequela of prior granulomatous disease. Musculoskeletal:  No acute osseous abnormality or suspicious osseous lesion. Multilevel degenerative changes are present in the imaged portions of the spine. Additional degenerative changes in the shoulders. No worrisome chest wall lesions. Review of the MIP images confirms the above findings. IMPRESSION: 1. No evidence of pulmonary embolism. 2. No acute intrathoracic process. 3. Three-vessel coronary artery atherosclerosis. 4. Aortic Atherosclerosis (ICD10-I70.0). Electronically Signed   By: Lovena Le M.D.   On: 09/21/2020 16:50   MR BRAIN WO CONTRAST  Result Date: 09/21/2020 CLINICAL DATA:  Initial evaluation for acute syncope. EXAM: MRI HEAD WITHOUT CONTRAST TECHNIQUE: Multiplanar, multiecho pulse sequences of the brain and surrounding structures were obtained without intravenous contrast. COMPARISON:  Prior CT from 09/21/2020. FINDINGS: Brain: Cerebral volume within normal limits for age. No significant cerebral white matter disease. 12 mm remote lacunar infarct noted involving the ventral left paramedian pons (series 16, image 55). Patchy small volume diffusion signal abnormality seen involving the right cerebellum, consistent with acute to early subacute ischemic infarcts (series 5, images 12, 11). The largest of these foci measures 5 mm. No associated hemorrhage or mass effect. No other diffusion abnormality to suggest acute or subacute ischemia. Gray-white matter differentiation otherwise maintained. No other areas of encephalomalacia to suggest chronic cortical infarction. No foci of susceptibility artifact to suggest acute or chronic intracranial hemorrhage. No mass lesion, midline shift or mass effect. No hydrocephalus or extra-axial fluid collection. Pituitary gland and suprasellar region within normal limits. Midline structures intact. Vascular: Heterogeneous flow void within the right vertebral artery, which could be related to  slow flow and/or occlusion (series 10, image 3). Major intracranial vascular flow  voids otherwise maintained. Skull and upper cervical spine: Craniocervical junction within normal limits. Bone marrow signal intensity normal. No scalp soft tissue abnormality. Sinuses/Orbits: Globes and orbital soft tissues within normal limits. Scattered mucosal thickening noted throughout the ethmoidal air cells and maxillary sinuses. No mastoid effusion. Inner ear structures grossly normal. Other: None. IMPRESSION: 1. Patchy small volume acute to early subacute ischemic infarcts involving the right cerebellum. No associated hemorrhage or mass effect. 2. 12 mm remote lacunar infarct involving the ventral left paramedian pons. 3. Heterogeneous flow void within the right vertebral artery, which could be related to slow flow and/or occlusion. 4. Otherwise normal brain MRI for age. Electronically Signed   By: Jeannine Boga M.D.   On: 09/21/2020 22:21   US Venous Img Lower Bilateral (DVT)  Result Date: 09/21/2020 CLINICAL DATA:  Pain, positive D-dimer EXAM: BILATERAL LOWER EXTREMITY VENOUS DOPPLER ULTRASOUND TECHNIQUE: Gray-scale sonography with compression, as well as color and duplex ultrasound, were performed to evaluate the deep venous system(s) from the level of the common femoral vein through the popliteal and proximal calf veins. COMPARISON:  None. FINDINGS: VENOUS Normal compressibility of the common femoral, superficial femoral, and popliteal veins, as well as the visualized calf veins. Visualized portions of profunda femoral vein and great saphenous vein unremarkable. No filling defects to suggest DVT on grayscale or color Doppler imaging. Doppler waveforms show normal direction of venous flow, normal respiratory plasticity and response to augmentation. Limited views of the contralateral common femoral vein are unremarkable. OTHER None. Limitations: none IMPRESSION: No acute DVT. Electronically Signed   By: Valentino Saxon MD   On: 09/21/2020 17:17   ECHOCARDIOGRAM COMPLETE  Result Date:  09/22/2020    ECHOCARDIOGRAM REPORT   Patient Name:   Latoya Herman Date of Exam: 09/22/2020 Medical Rec #:  [id number]       Height:       65.0 in Accession #:    [id number]      Weight:       137.1 lb Date of Birth:  1951-08-02        BSA:          1.685 m Patient Age:    64 years        BP:           141/75 mmHg Patient Gender: F               HR:           79 bpm. Exam Location:  [facility] Procedure: 2D Echo, Color Doppler and Cardiac Doppler Indications:     R55 Syncope  History:         Patient has no prior history of Echocardiogram examinations.                  CAD, CKD; Risk Factors:Hypertension, Diabetes and Dyslipidemia.  Sonographer:     Charmayne Sheer RDCS (AE) Referring Phys:  [id number] Lorenza Chick Diagnosing Phys: Isaias Cowman MD IMPRESSIONS  1. Left ventricular ejection fraction, by estimation, is 55 to 60%. The left ventricle has normal function. The left ventricle has no regional wall motion abnormalities. Left ventricular diastolic parameters are consistent with Grade I diastolic dysfunction (impaired relaxation).  2. Right ventricular systolic function is normal. The right ventricular size is normal.  3. The mitral valve is normal in structure. Trivial mitral valve regurgitation. No evidence of mitral stenosis.  4. The aortic  valve is normal in structure. Aortic valve regurgitation is mild. No aortic stenosis is present.  5. The inferior vena cava is normal in size with greater than 50% respiratory variability, suggesting right atrial pressure of 3 mmHg. FINDINGS  Left Ventricle: Left ventricular ejection fraction, by estimation, is 55 to 60%. The left ventricle has normal function. The left ventricle has no regional wall motion abnormalities. The left ventricular internal cavity size was normal in size. There is  no left ventricular hypertrophy. Left ventricular diastolic parameters are consistent with Grade I diastolic dysfunction (impaired relaxation). Right Ventricle: The right ventricular  size is normal. No increase in right ventricular wall thickness. Right ventricular systolic function is normal. Left Atrium: Left atrial size was normal in size. Right Atrium: Right atrial size was normal in size. Pericardium: There is no evidence of pericardial effusion. Mitral Valve: The mitral valve is normal in structure. Trivial mitral valve regurgitation. No evidence of mitral valve stenosis. MV peak gradient, 4.2 mmHg. The mean mitral valve gradient is 1.0 mmHg. Tricuspid Valve: The tricuspid valve is normal in structure. Tricuspid valve regurgitation is trivial. No evidence of tricuspid stenosis. Aortic Valve: The aortic valve is normal in structure. Aortic valve regurgitation is mild. No aortic stenosis is present. Aortic valve mean gradient measures 4.0 mmHg. Aortic valve peak gradient measures 7.2 mmHg. Aortic valve area, by VTI measures 1.47 cm. Pulmonic Valve: The pulmonic valve was normal in structure. Pulmonic valve regurgitation is not visualized. No evidence of pulmonic stenosis. Aorta: The aortic root is normal in size and structure. Venous: The inferior vena cava is normal in size with greater than 50% respiratory variability, suggesting right atrial pressure of 3 mmHg. IAS/Shunts: No atrial level shunt detected by color flow Doppler.  LEFT VENTRICLE PLAX 2D LVIDd:         4.00 cm  Diastology LVIDs:         2.67 cm  LV e' medial:    4.46 cm/s LV PW:         0.87 cm  LV E/e' medial:  15.7 LV IVS:        0.93 cm  LV e' lateral:   4.57 cm/s LVOT diam:     1.60 cm  LV E/e' lateral: 15.3 LV SV:         37 LV SV Index:   22 LVOT Area:     2.01 cm  RIGHT VENTRICLE RV Basal diam:  2.50 cm LEFT ATRIUM             Index       RIGHT ATRIUM          Index LA diam:        3.60 cm 2.14 cm/m  RA Area:     9.70 cm LA Vol (A2C):   20.4 ml 12.11 ml/m RA Volume:   19.70 ml 11.69 ml/m LA Vol (A4C):   33.8 ml 20.06 ml/m LA Biplane Vol: 28.2 ml 16.74 ml/m  AORTIC VALVE                   PULMONIC VALVE AV Area  (Vmax):    1.56 cm    PV Vmax:       1.03 m/s AV Area (Vmean):   1.43 cm    PV Vmean:      73.600 cm/s AV Area (VTI):     1.47 cm    PV VTI:        0.219 m AV Vmax:  134.00 cm/s PV Peak grad:  4.2 mmHg AV Vmean:          97.900 cm/s PV Mean grad:  2.0 mmHg AV VTI:            0.250 m AV Peak Grad:      7.2 mmHg AV Mean Grad:      4.0 mmHg LVOT Vmax:         104.00 cm/s LVOT Vmean:        69.500 cm/s LVOT VTI:          0.183 m LVOT/AV VTI ratio: 0.73  AORTA Ao Root diam: 2.80 cm MITRAL VALVE MV Area (PHT): 3.95 cm    SHUNTS MV Peak grad:  4.2 mmHg    Systemic VTI:  0.18 m MV Mean grad:  1.0 mmHg    Systemic Diam: 1.60 cm MV Vmax:       1.03 m/s MV Vmean:      52.9 cm/s MV Decel Time: 192 msec MV E velocity: 69.80 cm/s MV A velocity: 93.40 cm/s MV E/A ratio:  0.75 Isaias Cowman MD Electronically signed by Isaias Cowman MD Signature Date/Time: 09/22/2020/2:52:59 PM    Final      Assessment/Plan 1. Stenosis of right carotid artery Recommend:  The patient is symptomatic with respect to the right carotid stenosis.  The patient now has a lesion that is >95%.  Patient's CT angiography of the carotid arteries confirms >95% right ICA stenosis.  The anatomical considerations support stenting over surgery.  This was discussed in detail with the patient.  The risks, benefits and alternative therapies were reviewed in detail with the patient.  All questions were answered.  The patient agrees to proceed with stenting of the right carotid artery.  Continue antiplatelet therapy as prescribed. Continue management of CAD, HTN and Hyperlipidemia. Healthy heart diet, encouraged exercise at least 4 times per week.   2. Acute ischemic stroke ([place]) Continue antiplatelet therapy with Plavix and asa  3. Coronary artery disease of native artery of native heart with stable angina pectoris (HCC) Continue cardiac and antihypertensive medications as already ordered and reviewed, no changes at this  time.  Continue statin as ordered and reviewed, no changes at this time  Nitrates PRN for chest pain   4. Primary hypertension Continue antihypertensive medications as already ordered, these medications have been reviewed and there are no changes at this time.   5. Hyperlipidemia, unspecified hyperlipidemia type Continue statin as ordered and reviewed, no changes at this time    Hortencia Pilar, MD  10/07/2020 12:37 PM

## 2020-10-07 NOTE — Progress Notes (Signed)
MRN : [id number]  Latoya Herman is a 69 y.o. ([birth year]) female who presents with chief complaint of  History of Present Illness:   The patient is seen for evaluation of carotid stenosis. The carotid stenosis was identified after CT angiogram was ordered as workup for a CVA.  The patient denies interval amaurosis fugax. There is no recent/interval  history of TIA symptoms or focal motor deficits. There is a prior documented CVA.  There is no history of migraine headaches. There is no history of seizures.  The patient is taking enteric-coated aspirin 81 mg daily.  The patient has a history of coronary artery disease, no recent episodes of angina or shortness of breath. The patient denies PAD or claudication symptoms. There is a history of hyperlipidemia which is being treated with a statin.   CT angiogram of the Neck and Head is reviewed by me personally and with the patient.  It shows >40% RICA and 98% LICA with wide patency of the left vertebral and occlusion of the distal right vertebral   No outpatient medications have been marked as taking for the 10/07/20 encounter (Appointment) with Delana Meyer, Dolores Lory, MD.    Past Medical History:  Diagnosis Date  . Chronic kidney disease   . Coronary artery disease   . Diabetes mellitus without complication ([place])   . Hyperlipidemia   . Hypertension   . SOBOE (shortness of breath on exertion)     Past Surgical History:  Procedure Laterality Date  . COLONOSCOPY  04/23/2010  . COLONOSCOPY WITH ESOPHAGOGASTRODUODENOSCOPY (EGD)  04/23/2010  . COLONOSCOPY WITH PROPOFOL N/A 08/23/2020   Procedure: COLONOSCOPY WITH PROPOFOL;  Surgeon: Lesly Rubenstein, MD;  Location: [facility] ENDOSCOPY;  Service: Endoscopy;  Laterality: N/A;  . CORONARY ANGIOPLASTY WITH STENT PLACEMENT  10/19/2013  . SUPERFICIAL LYMPH NODE BIOPSY / EXCISION  2013    Social History Social History   Tobacco Use  . Smoking status: Never Smoker  . Smokeless tobacco:  Never Used  Substance Use Topics  . Alcohol use: Never  . Drug use: Never    Family History Family History  Problem Relation Age of Onset  . Breast cancer Neg Hx   No family history of bleeding/clotting disorders, porphyria or autoimmune disease   Allergies  Allergen Reactions  . Penicillins Swelling and Other (See Comments)  . Lisinopril Rash    Possible rash Possible rash      REVIEW OF SYSTEMS (Negative unless checked)  Constitutional: [] Weight loss  [] Fever  [] Chills Cardiac: [x] Chest pain   [] Chest pressure   [] Palpitations   [] Shortness of breath when laying flat   [] Shortness of breath with exertion. Vascular:  [] Pain in legs with walking   [] Pain in legs at rest  [] History of DVT   [] Phlebitis   [] Swelling in legs   [] Varicose veins   [] Non-healing ulcers Pulmonary:   [] Uses home oxygen   [] Productive cough   [] Hemoptysis   [] Wheeze  [] COPD   [] Asthma Neurologic:  [x] Dizziness   [] Seizures   [x] History of stroke   [x] History of TIA  [] Aphasia   [] Vissual changes   [] Weakness or numbness in arm   [] Weakness or numbness in leg Musculoskeletal:   [] Joint swelling   [] Joint pain   [] Low back pain Hematologic:  [] Easy bruising  [] Easy bleeding   [] Hypercoagulable state   [] Anemic Gastrointestinal:  [] Diarrhea   [] Vomiting  [] Gastroesophageal reflux/heartburn   [] Difficulty swallowing. Genitourinary:  [] Chronic kidney disease   [] Difficult urination  [] Frequent urination   []   Blood in urine Skin:  [] Rashes   [] Ulcers  Psychological:  [] History of anxiety   []  History of major depression.  Physical Examination  There were no vitals filed for this visit. There is no height or weight on file to calculate BMI. Gen: WD/WN, NAD Head: [place]/AT, No temporalis wasting.  Ear/Nose/Throat: Hearing grossly intact, nares w/o erythema or drainage, poor dentition Eyes: PER, EOMI, sclera nonicteric.  Neck: Supple, no masses.  No bruit or JVD.  Pulmonary:  Good air movement, clear to  auscultation bilaterally, no use of accessory muscles.  Cardiac: RRR, normal S1, S2, no Murmurs. Vascular:  Right carotid bruit noted Vessel Right Left  Radial Palpable Palpable  Carotid Palpable Palpable  Gastrointestinal: soft, non-distended. No guarding/no peritoneal signs.  Musculoskeletal: M/S 5/5 throughout.  No deformity or atrophy.  Neurologic: CN 2-12 intact. Pain and light touch intact in extremities.  Symmetrical.  Speech is fluent. Motor exam as listed above. Psychiatric: Judgment intact, Mood & affect appropriate for pt's clinical situation. Dermatologic: No rashes or ulcers noted.  No changes consistent with cellulitis.   CBC Lab Results  Component Value Date   WBC 9.1 09/23/2020   HGB 10.3 (L) 09/23/2020   HCT 30.2 (L) 09/23/2020   MCV 89.3 09/23/2020   PLT 306 09/23/2020    BMET    Component Value Date/Time   NA 135 09/23/2020 0452   NA 134 (L) 10/20/2013 0130   K 3.7 09/23/2020 0452   K 4.1 10/20/2013 0130   CL 102 09/23/2020 0452   CL 102 10/20/2013 0130   CO2 26 09/23/2020 0452   CO2 25 10/20/2013 0130   GLUCOSE 112 (H) 09/23/2020 0452   GLUCOSE 238 (H) 10/20/2013 0130   BUN 25 (H) 09/23/2020 0452   BUN 14 10/20/2013 0130   CREATININE 1.07 (H) 09/23/2020 0452   CREATININE 0.91 10/20/2013 0130   CALCIUM 8.9 09/23/2020 0452   CALCIUM 8.6 10/20/2013 0130   GFRNONAA 52 (L) 09/23/2020 0452   GFRNONAA >60 10/20/2013 0130   GFRAA >60 10/20/2013 0130   CrCl cannot be calculated (Unknown ideal weight.).  COAG Lab Results  Component Value Date   INR 0.9 09/22/2020    Radiology CT Code Stroke CTA Head W/WO contrast  Result Date: 09/22/2020 CLINICAL DATA:  Stroke/TIA. Sudden onset of dizziness and lightheadedness yesterday. Symptoms lasted for 1 hour. Abnormal MRI. 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 EXAM: CT ANGIOGRAPHY HEAD AND NECK TECHNIQUE: Multidetector CT imaging of the head and neck was performed using the standard  protocol during bolus administration of intravenous contrast. Multiplanar CT image reconstructions and MIPs were obtained to evaluate the vascular anatomy. Carotid stenosis measurements (when applicable) are obtained utilizing NASCET criteria, using the distal internal carotid diameter as the denominator. CONTRAST:  95mL OMNIPAQUE IOHEXOL 350 MG/ML SOLN COMPARISON:  MR head without contrast 09/21/2020. FINDINGS: Brain: Right cerebellar infarcts are less well seen than on the MRI. No new infarcts are present. No acute hemorrhage or mass lesion is present. The ventricles are of normal size. No significant extraaxial fluid collection is present. Vascular: Extensive atherosclerotic calcifications are present within the cavernous internal carotid arteries bilaterally. No hyperdense vessel is present. Skull: Calvarium is intact. No focal lytic or blastic lesions are present. No significant extracranial soft tissue lesion is present. Sinuses: The paranasal sinuses and mastoid air cells are clear. Orbits: The globes and orbits are within normal limits. CTA NECK FINDINGS Aortic arch: Atherosclerotic calcifications are present within the distal arch including the  origin of the left subclavian artery. No significant stenosis is present. Common origin of the left common carotid artery and innominate artery is noted. Right carotid system: Right common carotid artery is within normal limits. The proximal right ICA is narrowed to 1 mm. More distal cervical right ICA is within normal limits. Left carotid system: Left common carotid artery demonstrates some mural calcification without significant stenosis. Atherosclerotic changes are present at the bifurcation. No significant stenosis is present relative to the more distal vessel. Atherosclerotic calcifications are present along the wall of distal right ICA, just below the skull base. Vertebral arteries: The left vertebral artery is dominant vessel. Both vertebral arteries originate  from the subclavian arteries. Segmental narrowing is present in the distal right P2 segment. High-grade stenosis is present in the right vertebral artery at the dural margin. Skeleton: Anterior fusion is present at C6-7. Adjacent level degenerative changes are present C5-6 and to lesser extent at C4-5. Slight anterolisthesis present at C4-5. Straightening of the normal cervical lordosis is present. Other neck: The soft tissues the neck are otherwise unremarkable. Upper chest: Lung apices are clear. Review of the MIP images confirms the above findings CTA HEAD FINDINGS Anterior circulation: Atherosclerotic calcifications are present within the cavernous internal carotid arteries bilaterally. Mild narrowing of less than 50% is present bilaterally. ICA termini within normal limits. The A1 and M1 segments are normal. The anterior communicating artery is patent. MCA bifurcations are within normal limits. ACA and MCA branch vessels are within normal limits. Posterior circulation: Left vertebral artery is dominant vessel. Left PICA origin is visualized and normal. High-grade stenosis is present at the dural margin of the right vertebral artery. The right V4 segment is occluded beyond the PICA. Basilar artery is normal. Both posterior cerebral arteries originate from the basilar tip. PCA branch vessels are within normal limits. Venous sinuses: Dural sinuses are patent. The straight sinus deep cerebral veins are intact. Cortical veins are within normal limits. Anatomic variants: None Review of the MIP images confirms the above findings IMPRESSION: 1. 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 2. High-grade, near occlusive stenosis of the proximal right internal carotid artery measuring up to 1 mm. 3. High-grade stenosis of the right vertebral artery at the dural margin. 4. Atherosclerotic changes at the left carotid bifurcation and cavernous internal carotid arteries bilaterally without significant stenosis relative  to the more distal vessels. 5. No other significant proximal stenosis, aneurysm, or branch vessel occlusion within the Circle of Willis. 6. Multilevel spondylosis of the cervical spine. 7. Aortic Atherosclerosis (ICD10-I70.0). Electronically Signed   By: San Morelle M.D.   On: 09/22/2020 16:38   DG Chest 2 View  Result Date: 09/21/2020 CLINICAL DATA:  Dizziness. EXAM: CHEST - 2 VIEW COMPARISON:  Chest radiograph 07/14/2005. FINDINGS: The heart size and mediastinal contours are within normal limits. Both lungs are clear. The visualized skeletal structures are unremarkable. IMPRESSION: No active cardiopulmonary disease. Electronically Signed   By: Lovey Newcomer M.D.   On: 09/21/2020 14:14   CT Head Wo Contrast  Result Date: 09/21/2020 CLINICAL DATA:  Patient with dizziness. EXAM: CT HEAD WITHOUT CONTRAST TECHNIQUE: Contiguous axial images were obtained from the base of the skull through the vertex without intravenous contrast. COMPARISON:  None. FINDINGS: Brain: Ventricles and sulci are appropriate for patient's age. No evidence for acute cortically based infarct, intracranial hemorrhage, mass lesion or mass-effect. Vascular: Unremarkable Skull: Intact. Sinuses/Orbits: Paranasal sinuses well aerated. Mastoid air cells unremarkable. Orbits unremarkable. Other: None. IMPRESSION: No  acute intracranial process. Electronically Signed   By: Lovey Newcomer M.D.   On: 09/21/2020 14:25   CT Code Stroke CTA Neck W/WO contrast  Result Date: 09/22/2020 CLINICAL DATA:  Stroke/TIA. Sudden onset of dizziness and lightheadedness yesterday. Symptoms lasted for 1 hour. Abnormal MRI. 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 EXAM: CT ANGIOGRAPHY HEAD AND NECK TECHNIQUE: Multidetector CT imaging of the head and neck was performed using the standard protocol during bolus administration of intravenous contrast. Multiplanar CT image reconstructions and MIPs were obtained to evaluate the vascular anatomy.  Carotid stenosis measurements (when applicable) are obtained utilizing NASCET criteria, using the distal internal carotid diameter as the denominator. CONTRAST:  54mL OMNIPAQUE IOHEXOL 350 MG/ML SOLN COMPARISON:  MR head without contrast 09/21/2020. FINDINGS: Brain: Right cerebellar infarcts are less well seen than on the MRI. No new infarcts are present. No acute hemorrhage or mass lesion is present. The ventricles are of normal size. No significant extraaxial fluid collection is present. Vascular: Extensive atherosclerotic calcifications are present within the cavernous internal carotid arteries bilaterally. No hyperdense vessel is present. Skull: Calvarium is intact. No focal lytic or blastic lesions are present. No significant extracranial soft tissue lesion is present. Sinuses: The paranasal sinuses and mastoid air cells are clear. Orbits: The globes and orbits are within normal limits. CTA NECK FINDINGS Aortic arch: Atherosclerotic calcifications are present within the distal arch including the origin of the left subclavian artery. No significant stenosis is present. Common origin of the left common carotid artery and innominate artery is noted. Right carotid system: Right common carotid artery is within normal limits. The proximal right ICA is narrowed to 1 mm. More distal cervical right ICA is within normal limits. Left carotid system: Left common carotid artery demonstrates some mural calcification without significant stenosis. Atherosclerotic changes are present at the bifurcation. No significant stenosis is present relative to the more distal vessel. Atherosclerotic calcifications are present along the wall of distal right ICA, just below the skull base. Vertebral arteries: The left vertebral artery is dominant vessel. Both vertebral arteries originate from the subclavian arteries. Segmental narrowing is present in the distal right P2 segment. High-grade stenosis is present in the right vertebral artery  at the dural margin. Skeleton: Anterior fusion is present at C6-7. Adjacent level degenerative changes are present C5-6 and to lesser extent at C4-5. Slight anterolisthesis present at C4-5. Straightening of the normal cervical lordosis is present. Other neck: The soft tissues the neck are otherwise unremarkable. Upper chest: Lung apices are clear. Review of the MIP images confirms the above findings CTA HEAD FINDINGS Anterior circulation: Atherosclerotic calcifications are present within the cavernous internal carotid arteries bilaterally. Mild narrowing of less than 50% is present bilaterally. ICA termini within normal limits. The A1 and M1 segments are normal. The anterior communicating artery is patent. MCA bifurcations are within normal limits. ACA and MCA branch vessels are within normal limits. Posterior circulation: Left vertebral artery is dominant vessel. Left PICA origin is visualized and normal. High-grade stenosis is present at the dural margin of the right vertebral artery. The right V4 segment is occluded beyond the PICA. Basilar artery is normal. Both posterior cerebral arteries originate from the basilar tip. PCA branch vessels are within normal limits. Venous sinuses: Dural sinuses are patent. The straight sinus deep cerebral veins are intact. Cortical veins are within normal limits. Anatomic variants: None Review of the MIP images confirms the above findings IMPRESSION: 1. 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  2. High-grade, near occlusive stenosis of the proximal right internal carotid artery measuring up to 1 mm. 3. High-grade stenosis of the right vertebral artery at the dural margin. 4. Atherosclerotic changes at the left carotid bifurcation and cavernous internal carotid arteries bilaterally without significant stenosis relative to the more distal vessels. 5. No other significant proximal stenosis, aneurysm, or branch vessel occlusion within the Circle of Willis. 6. Multilevel  spondylosis of the cervical spine. 7. Aortic Atherosclerosis (ICD10-I70.0). Electronically Signed   By: San Morelle M.D.   On: 09/22/2020 16:38   CT ANGIO CHEST PE W OR WO CONTRAST  Result Date: 09/21/2020 CLINICAL DATA:  Syncope, dizziness after cooking breakfast, lightheadedness EXAM: CT ANGIOGRAPHY CHEST WITH CONTRAST TECHNIQUE: Multidetector CT imaging of the chest was performed using the standard protocol during bolus administration of intravenous contrast. Multiplanar CT image reconstructions and MIPs were obtained to evaluate the vascular anatomy. CONTRAST:  50mL OMNIPAQUE IOHEXOL 350 MG/ML SOLN COMPARISON:  CT chest 08/26/2006 FINDINGS: Cardiovascular: Satisfactory opacification the pulmonary arteries to the segmental level. No pulmonary artery filling defects are identified. Central pulmonary arteries are normal caliber. Normal heart size. No pericardial effusion. Three-vessel coronary artery atherosclerosis. Suboptimal opacification of the aorta. Atherosclerotic plaque within the normal caliber aorta. No gross luminal abnormality. No periaortic stranding or hemorrhage. Shared origin of the brachiocephalic and left common carotid arteries. Minimal plaque in the otherwise unremarkable proximal great vessels. Mediastinum/Nodes: No mediastinal fluid or gas. Normal thyroid gland and thoracic inlet. No acute abnormality of the trachea or esophagus. No worrisome mediastinal, hilar or axillary adenopathy. Lungs/Pleura: Dependent atelectatic changes. No consolidation, features of edema, pneumothorax, or effusion. Tiny 3 mm juxtapleural nodule along the right hemidiaphragm (6/50, 7/36), possibly subpleural lymph node. No other concerning nodules or masses. Mild biapical pleuroparenchymal scarring. Upper Abdomen: No acute abnormalities present in the visualized portions of the upper abdomen. Punctate calcification in the liver, may reflect remote sequela of prior granulomatous disease. Musculoskeletal:  No acute osseous abnormality or suspicious osseous lesion. Multilevel degenerative changes are present in the imaged portions of the spine. Additional degenerative changes in the shoulders. No worrisome chest wall lesions. Review of the MIP images confirms the above findings. IMPRESSION: 1. No evidence of pulmonary embolism. 2. No acute intrathoracic process. 3. Three-vessel coronary artery atherosclerosis. 4. Aortic Atherosclerosis (ICD10-I70.0). Electronically Signed   By: Lovena Le M.D.   On: 09/21/2020 16:50   MR BRAIN WO CONTRAST  Result Date: 09/21/2020 CLINICAL DATA:  Initial evaluation for acute syncope. EXAM: MRI HEAD WITHOUT CONTRAST TECHNIQUE: Multiplanar, multiecho pulse sequences of the brain and surrounding structures were obtained without intravenous contrast. COMPARISON:  Prior CT from 09/21/2020. FINDINGS: Brain: Cerebral volume within normal limits for age. No significant cerebral white matter disease. 12 mm remote lacunar infarct noted involving the ventral left paramedian pons (series 16, image 55). Patchy small volume diffusion signal abnormality seen involving the right cerebellum, consistent with acute to early subacute ischemic infarcts (series 5, images 12, 11). The largest of these foci measures 5 mm. No associated hemorrhage or mass effect. No other diffusion abnormality to suggest acute or subacute ischemia. Gray-white matter differentiation otherwise maintained. No other areas of encephalomalacia to suggest chronic cortical infarction. No foci of susceptibility artifact to suggest acute or chronic intracranial hemorrhage. No mass lesion, midline shift or mass effect. No hydrocephalus or extra-axial fluid collection. Pituitary gland and suprasellar region within normal limits. Midline structures intact. Vascular: Heterogeneous flow void within the right vertebral artery, which could be related to  slow flow and/or occlusion (series 10, image 3). Major intracranial vascular flow  voids otherwise maintained. Skull and upper cervical spine: Craniocervical junction within normal limits. Bone marrow signal intensity normal. No scalp soft tissue abnormality. Sinuses/Orbits: Globes and orbital soft tissues within normal limits. Scattered mucosal thickening noted throughout the ethmoidal air cells and maxillary sinuses. No mastoid effusion. Inner ear structures grossly normal. Other: None. IMPRESSION: 1. Patchy small volume acute to early subacute ischemic infarcts involving the right cerebellum. No associated hemorrhage or mass effect. 2. 12 mm remote lacunar infarct involving the ventral left paramedian pons. 3. Heterogeneous flow void within the right vertebral artery, which could be related to slow flow and/or occlusion. 4. Otherwise normal brain MRI for age. Electronically Signed   By: Jeannine Boga M.D.   On: 09/21/2020 22:21   US Venous Img Lower Bilateral (DVT)  Result Date: 09/21/2020 CLINICAL DATA:  Pain, positive D-dimer EXAM: BILATERAL LOWER EXTREMITY VENOUS DOPPLER ULTRASOUND TECHNIQUE: Gray-scale sonography with compression, as well as color and duplex ultrasound, were performed to evaluate the deep venous system(s) from the level of the common femoral vein through the popliteal and proximal calf veins. COMPARISON:  None. FINDINGS: VENOUS Normal compressibility of the common femoral, superficial femoral, and popliteal veins, as well as the visualized calf veins. Visualized portions of profunda femoral vein and great saphenous vein unremarkable. No filling defects to suggest DVT on grayscale or color Doppler imaging. Doppler waveforms show normal direction of venous flow, normal respiratory plasticity and response to augmentation. Limited views of the contralateral common femoral vein are unremarkable. OTHER None. Limitations: none IMPRESSION: No acute DVT. Electronically Signed   By: Valentino Saxon MD   On: 09/21/2020 17:17   ECHOCARDIOGRAM COMPLETE  Result Date:  09/22/2020    ECHOCARDIOGRAM REPORT   Patient Name:   Latoya Herman Date of Exam: 09/22/2020 Medical Rec #:  [id number]       Height:       65.0 in Accession #:    [id number]      Weight:       137.1 lb Date of Birth:  10-Apr-1951        BSA:          1.685 m Patient Age:    48 years        BP:           141/75 mmHg Patient Gender: F               HR:           79 bpm. Exam Location:  [facility] Procedure: 2D Echo, Color Doppler and Cardiac Doppler Indications:     R55 Syncope  History:         Patient has no prior history of Echocardiogram examinations.                  CAD, CKD; Risk Factors:Hypertension, Diabetes and Dyslipidemia.  Sonographer:     Charmayne Sheer RDCS (AE) Referring Phys:  [id number] Lorenza Chick Diagnosing Phys: Isaias Cowman MD IMPRESSIONS  1. Left ventricular ejection fraction, by estimation, is 55 to 60%. The left ventricle has normal function. The left ventricle has no regional wall motion abnormalities. Left ventricular diastolic parameters are consistent with Grade I diastolic dysfunction (impaired relaxation).  2. Right ventricular systolic function is normal. The right ventricular size is normal.  3. The mitral valve is normal in structure. Trivial mitral valve regurgitation. No evidence of mitral stenosis.  4. The aortic  valve is normal in structure. Aortic valve regurgitation is mild. No aortic stenosis is present.  5. The inferior vena cava is normal in size with greater than 50% respiratory variability, suggesting right atrial pressure of 3 mmHg. FINDINGS  Left Ventricle: Left ventricular ejection fraction, by estimation, is 55 to 60%. The left ventricle has normal function. The left ventricle has no regional wall motion abnormalities. The left ventricular internal cavity size was normal in size. There is  no left ventricular hypertrophy. Left ventricular diastolic parameters are consistent with Grade I diastolic dysfunction (impaired relaxation). Right Ventricle: The right ventricular  size is normal. No increase in right ventricular wall thickness. Right ventricular systolic function is normal. Left Atrium: Left atrial size was normal in size. Right Atrium: Right atrial size was normal in size. Pericardium: There is no evidence of pericardial effusion. Mitral Valve: The mitral valve is normal in structure. Trivial mitral valve regurgitation. No evidence of mitral valve stenosis. MV peak gradient, 4.2 mmHg. The mean mitral valve gradient is 1.0 mmHg. Tricuspid Valve: The tricuspid valve is normal in structure. Tricuspid valve regurgitation is trivial. No evidence of tricuspid stenosis. Aortic Valve: The aortic valve is normal in structure. Aortic valve regurgitation is mild. No aortic stenosis is present. Aortic valve mean gradient measures 4.0 mmHg. Aortic valve peak gradient measures 7.2 mmHg. Aortic valve area, by VTI measures 1.47 cm. Pulmonic Valve: The pulmonic valve was normal in structure. Pulmonic valve regurgitation is not visualized. No evidence of pulmonic stenosis. Aorta: The aortic root is normal in size and structure. Venous: The inferior vena cava is normal in size with greater than 50% respiratory variability, suggesting right atrial pressure of 3 mmHg. IAS/Shunts: No atrial level shunt detected by color flow Doppler.  LEFT VENTRICLE PLAX 2D LVIDd:         4.00 cm  Diastology LVIDs:         2.67 cm  LV e' medial:    4.46 cm/s LV PW:         0.87 cm  LV E/e' medial:  15.7 LV IVS:        0.93 cm  LV e' lateral:   4.57 cm/s LVOT diam:     1.60 cm  LV E/e' lateral: 15.3 LV SV:         37 LV SV Index:   22 LVOT Area:     2.01 cm  RIGHT VENTRICLE RV Basal diam:  2.50 cm LEFT ATRIUM             Index       RIGHT ATRIUM          Index LA diam:        3.60 cm 2.14 cm/m  RA Area:     9.70 cm LA Vol (A2C):   20.4 ml 12.11 ml/m RA Volume:   19.70 ml 11.69 ml/m LA Vol (A4C):   33.8 ml 20.06 ml/m LA Biplane Vol: 28.2 ml 16.74 ml/m  AORTIC VALVE                   PULMONIC VALVE AV Area  (Vmax):    1.56 cm    PV Vmax:       1.03 m/s AV Area (Vmean):   1.43 cm    PV Vmean:      73.600 cm/s AV Area (VTI):     1.47 cm    PV VTI:        0.219 m AV Vmax:  134.00 cm/s PV Peak grad:  4.2 mmHg AV Vmean:          97.900 cm/s PV Mean grad:  2.0 mmHg AV VTI:            0.250 m AV Peak Grad:      7.2 mmHg AV Mean Grad:      4.0 mmHg LVOT Vmax:         104.00 cm/s LVOT Vmean:        69.500 cm/s LVOT VTI:          0.183 m LVOT/AV VTI ratio: 0.73  AORTA Ao Root diam: 2.80 cm MITRAL VALVE MV Area (PHT): 3.95 cm    SHUNTS MV Peak grad:  4.2 mmHg    Systemic VTI:  0.18 m MV Mean grad:  1.0 mmHg    Systemic Diam: 1.60 cm MV Vmax:       1.03 m/s MV Vmean:      52.9 cm/s MV Decel Time: 192 msec MV E velocity: 69.80 cm/s MV A velocity: 93.40 cm/s MV E/A ratio:  0.75 Isaias Cowman MD Electronically signed by Isaias Cowman MD Signature Date/Time: 09/22/2020/2:52:59 PM    Final      Assessment/Plan 1. Stenosis of right carotid artery Recommend:  The patient is symptomatic with respect to the right carotid stenosis.  The patient now has a lesion that is >95%.  Patient's CT angiography of the carotid arteries confirms >95% right ICA stenosis.  The anatomical considerations support stenting over surgery.  This was discussed in detail with the patient.  The risks, benefits and alternative therapies were reviewed in detail with the patient.  All questions were answered.  The patient agrees to proceed with stenting of the right carotid artery.  Continue antiplatelet therapy as prescribed. Continue management of CAD, HTN and Hyperlipidemia. Healthy heart diet, encouraged exercise at least 4 times per week.   2. Acute ischemic stroke ([place]) Continue antiplatelet therapy with Plavix and asa  3. Coronary artery disease of native artery of native heart with stable angina pectoris (HCC) Continue cardiac and antihypertensive medications as already ordered and reviewed, no changes at this  time.  Continue statin as ordered and reviewed, no changes at this time  Nitrates PRN for chest pain   4. Primary hypertension Continue antihypertensive medications as already ordered, these medications have been reviewed and there are no changes at this time.   5. Hyperlipidemia, unspecified hyperlipidemia type Continue statin as ordered and reviewed, no changes at this time    Hortencia Pilar, MD  10/07/2020 12:37 PM

## 2020-10-18 ENCOUNTER — Telehealth (INDEPENDENT_AMBULATORY_CARE_PROVIDER_SITE_OTHER): Payer: Self-pay

## 2020-10-18 NOTE — Telephone Encounter (Signed)
Pt called and left a VM on the nurses line wanting to know if she was ok to get a dental filling since she is going to be having a carotid PTA stent intervention on the 10th of November her dental appointment is on the 16 th of November for the filling I made her aware that that the dental office must fax over a Clearance  and  Gave her the fax number to out office.

## 2020-10-21 ENCOUNTER — Other Ambulatory Visit
Admission: RE | Admit: 2020-10-21 | Discharge: 2020-10-21 | Disposition: A | Discharge status: Home / Self Care | Payer: Medicare HMO | Source: Ambulatory Visit | Attending: Vascular Surgery | Admitting: Vascular Surgery

## 2020-10-21 ENCOUNTER — Other Ambulatory Visit: Payer: Self-pay

## 2020-10-21 DIAGNOSIS — Z01818 Encounter for other preprocedural examination: Secondary | ICD-10-CM | POA: Insufficient documentation

## 2020-10-21 DIAGNOSIS — Z20822 Contact with and (suspected) exposure to covid-19: Secondary | ICD-10-CM | POA: Insufficient documentation

## 2020-10-22 ENCOUNTER — Other Ambulatory Visit (INDEPENDENT_AMBULATORY_CARE_PROVIDER_SITE_OTHER): Payer: Self-pay | Admitting: Nurse Practitioner

## 2020-10-22 LAB — SARS CORONAVIRUS 2 (TAT 6-24 HRS): SARS Coronavirus 2: NEGATIVE

## 2020-10-23 ENCOUNTER — Encounter
Admission: RE | Disposition: A | Discharge status: Home / Self Care | Payer: Self-pay | Source: Home / Self Care | Attending: Vascular Surgery

## 2020-10-23 ENCOUNTER — Encounter: Payer: Self-pay | Admitting: Vascular Surgery

## 2020-10-23 ENCOUNTER — Other Ambulatory Visit: Payer: Self-pay

## 2020-10-23 ENCOUNTER — Inpatient Hospital Stay
Admission: RE | Admit: 2020-10-23 | Discharge: 2020-10-24 | DRG: 036 | Disposition: A | Discharge status: Home / Self Care | Payer: Medicare HMO | Attending: Vascular Surgery | Admitting: Vascular Surgery

## 2020-10-23 DIAGNOSIS — Z8673 Personal history of transient ischemic attack (TIA), and cerebral infarction without residual deficits: Secondary | ICD-10-CM | POA: Diagnosis not present

## 2020-10-23 DIAGNOSIS — Z20822 Contact with and (suspected) exposure to covid-19: Secondary | ICD-10-CM | POA: Diagnosis present

## 2020-10-23 DIAGNOSIS — R0602 Shortness of breath: Secondary | ICD-10-CM | POA: Diagnosis present

## 2020-10-23 DIAGNOSIS — I10 Essential (primary) hypertension: Secondary | ICD-10-CM | POA: Diagnosis present

## 2020-10-23 DIAGNOSIS — Z7902 Long term (current) use of antithrombotics/antiplatelets: Secondary | ICD-10-CM | POA: Diagnosis not present

## 2020-10-23 DIAGNOSIS — Z888 Allergy status to other drugs, medicaments and biological substances status: Secondary | ICD-10-CM | POA: Diagnosis not present

## 2020-10-23 DIAGNOSIS — M5030 Other cervical disc degeneration, unspecified cervical region: Secondary | ICD-10-CM | POA: Diagnosis present

## 2020-10-23 DIAGNOSIS — Z880 Allergy status to penicillin: Secondary | ICD-10-CM

## 2020-10-23 DIAGNOSIS — I63239 Cerebral infarction due to unspecified occlusion or stenosis of unspecified carotid arteries: Secondary | ICD-10-CM | POA: Diagnosis present

## 2020-10-23 DIAGNOSIS — E119 Type 2 diabetes mellitus without complications: Secondary | ICD-10-CM | POA: Diagnosis present

## 2020-10-23 DIAGNOSIS — I2510 Atherosclerotic heart disease of native coronary artery without angina pectoris: Secondary | ICD-10-CM | POA: Diagnosis present

## 2020-10-23 DIAGNOSIS — E785 Hyperlipidemia, unspecified: Secondary | ICD-10-CM | POA: Diagnosis present

## 2020-10-23 DIAGNOSIS — I6529 Occlusion and stenosis of unspecified carotid artery: Secondary | ICD-10-CM | POA: Diagnosis present

## 2020-10-23 DIAGNOSIS — I6521 Occlusion and stenosis of right carotid artery: Secondary | ICD-10-CM | POA: Diagnosis present

## 2020-10-23 DIAGNOSIS — Z7982 Long term (current) use of aspirin: Secondary | ICD-10-CM

## 2020-10-23 HISTORY — PX: CAROTID PTA/STENT INTERVENTION: CATH118231

## 2020-10-23 LAB — CREATININE, SERUM
Creatinine, Ser: 0.94 mg/dL (ref 0.44–1.00)
GFR, Estimated: >60 mL/min (ref >60)

## 2020-10-23 LAB — BUN: BUN: 20 mg/dL (ref 8–23)

## 2020-10-23 LAB — GLUCOSE, CAPILLARY
Glucose-Capillary: 127 mg/dL — ABNORMAL HIGH (ref 70–99)
Glucose-Capillary: 88 mg/dL (ref 70–99)

## 2020-10-23 SURGERY — CAROTID PTA/STENT INTERVENTION
Anesthesia: Moderate Sedation | Laterality: Right

## 2020-10-23 MED ORDER — LABETALOL HCL 5 MG/ML IV SOLN: 10.0000 mg | INTRAVENOUS | Status: DC | PRN

## 2020-10-23 MED ORDER — HYDROMORPHONE HCL 1 MG/ML IJ SOLN: 1.0000 mg | Freq: Once | INTRAMUSCULAR | Status: DC | PRN

## 2020-10-23 MED ORDER — DOCUSATE SODIUM 100 MG PO CAPS
100.0000 mg | ORAL_CAPSULE | Freq: Every day | ORAL | Status: DC
  Administered 2020-10-24: 100 mg via ORAL
  Filled 2020-10-23: qty 1

## 2020-10-23 MED ORDER — CARVEDILOL 25 MG PO TABS: 25.0000 mg | ORAL_TABLET | Freq: Two times a day (BID) | ORAL | Status: DC

## 2020-10-23 MED ORDER — SODIUM CHLORIDE 0.9 % IV SOLN: INTRAVENOUS | Status: AC

## 2020-10-23 MED ORDER — PHENOL 1.4 % MT LIQD
1.0000 | OROMUCOSAL | Status: DC | PRN
  Filled 2020-10-23: qty 177

## 2020-10-23 MED ORDER — MORPHINE SULFATE (PF) 4 MG/ML IV SOLN: 2.0000 mg | INTRAVENOUS | Status: DC | PRN

## 2020-10-23 MED ORDER — PHENYLEPHRINE HCL (PRESSORS) 10 MG/ML IV SOLN
INTRAVENOUS | Status: AC
  Filled 2020-10-23: qty 1

## 2020-10-23 MED ORDER — FENTANYL CITRATE (PF) 100 MCG/2ML IJ SOLN
INTRAMUSCULAR | Status: DC | PRN
  Administered 2020-10-23 (×2): 50 ug via INTRAVENOUS

## 2020-10-23 MED ORDER — GABAPENTIN 100 MG PO CAPS
ORAL_CAPSULE | ORAL | Status: AC
  Administered 2020-10-23: 100 mg via ORAL
  Filled 2020-10-23: qty 1

## 2020-10-23 MED ORDER — MIDAZOLAM HCL 2 MG/2ML IJ SOLN
INTRAMUSCULAR | Status: AC
  Filled 2020-10-23: qty 2

## 2020-10-23 MED ORDER — ACETAMINOPHEN 325 MG PO TABS: 325.0000 mg | ORAL_TABLET | ORAL | Status: DC | PRN

## 2020-10-23 MED ORDER — SODIUM CHLORIDE 0.9 % IV SOLN
INTRAVENOUS | Status: DC
Start: 2020-10-23 — End: 2020-10-23

## 2020-10-23 MED ORDER — DIPHENHYDRAMINE HCL 50 MG/ML IJ SOLN: 50.0000 mg | Freq: Once | INTRAMUSCULAR | Status: DC | PRN

## 2020-10-23 MED ORDER — ACETAMINOPHEN 325 MG RE SUPP
325.0000 mg | RECTAL | Status: DC | PRN
  Filled 2020-10-23: qty 2

## 2020-10-23 MED ORDER — SPIRONOLACTONE 25 MG PO TABS
25.0000 mg | ORAL_TABLET | Freq: Every day | ORAL | Status: DC
  Filled 2020-10-23: qty 1

## 2020-10-23 MED ORDER — FAMOTIDINE 20 MG PO TABS: 40.0000 mg | ORAL_TABLET | Freq: Once | ORAL | Status: DC | PRN

## 2020-10-23 MED ORDER — PANTOPRAZOLE SODIUM 40 MG IV SOLR
40.0000 mg | Freq: Every day | INTRAVENOUS | Status: DC
  Administered 2020-10-23: 40 mg via INTRAVENOUS
  Filled 2020-10-23: qty 40

## 2020-10-23 MED ORDER — SODIUM CHLORIDE 0.9 % IV SOLN: INTRAVENOUS | Status: DC

## 2020-10-23 MED ORDER — ATROPINE SULFATE 1 MG/10ML IJ SOSY
PREFILLED_SYRINGE | INTRAMUSCULAR | Status: AC
  Filled 2020-10-23: qty 10

## 2020-10-23 MED ORDER — KCL IN DEXTROSE-NACL 20-5-0.9 MEQ/L-%-% IV SOLN
INTRAVENOUS | Status: DC
  Filled 2020-10-23: qty 1000

## 2020-10-23 MED ORDER — MIDAZOLAM HCL 2 MG/ML PO SYRP: 8.0000 mg | ORAL_SOLUTION | Freq: Once | ORAL | Status: DC | PRN

## 2020-10-23 MED ORDER — INSULIN ASPART 100 UNIT/ML ~~LOC~~ SOLN: 0.0000 [IU] | Freq: Three times a day (TID) | SUBCUTANEOUS | Status: DC

## 2020-10-23 MED ORDER — GABAPENTIN 100 MG PO CAPS: 100.0000 mg | ORAL_CAPSULE | Freq: Every day | ORAL | Status: DC

## 2020-10-23 MED ORDER — ONDANSETRON HCL 4 MG/2ML IJ SOLN
4.0000 mg | Freq: Four times a day (QID) | INTRAMUSCULAR | Status: DC | PRN
  Administered 2020-10-24: 4 mg via INTRAVENOUS

## 2020-10-23 MED ORDER — CLINDAMYCIN PHOSPHATE 300 MG/50ML IV SOLN
INTRAVENOUS | Status: AC
  Administered 2020-10-23: 300 mg via INTRAVENOUS
  Filled 2020-10-23: qty 50

## 2020-10-23 MED ORDER — LEVOTHYROXINE SODIUM 75 MCG PO TABS
75.0000 ug | ORAL_TABLET | Freq: Every day | ORAL | Status: DC
  Administered 2020-10-24: 75 ug via ORAL
  Filled 2020-10-23 (×2): qty 1

## 2020-10-23 MED ORDER — ATROPINE SULFATE 1 MG/10ML IJ SOSY
PREFILLED_SYRINGE | INTRAMUSCULAR | Status: DC | PRN
  Administered 2020-10-23: 1 mg via INTRAVENOUS

## 2020-10-23 MED ORDER — ASPIRIN 81 MG PO CHEW: 81.0000 mg | CHEWABLE_TABLET | Freq: Every day | ORAL | Status: DC

## 2020-10-23 MED ORDER — OXYCODONE HCL 5 MG PO TABS: 5.0000 mg | ORAL_TABLET | ORAL | Status: DC | PRN

## 2020-10-23 MED ORDER — MIDAZOLAM HCL 2 MG/2ML IJ SOLN
INTRAMUSCULAR | Status: DC | PRN
  Administered 2020-10-23: 2 mg via INTRAVENOUS
  Administered 2020-10-23: 1 mg via INTRAVENOUS

## 2020-10-23 MED ORDER — ALUM & MAG HYDROXIDE-SIMETH 200-200-20 MG/5ML PO SUSP: 15.0000 mL | ORAL | Status: DC | PRN

## 2020-10-23 MED ORDER — GUAIFENESIN-DM 100-10 MG/5ML PO SYRP
15.0000 mL | ORAL_SOLUTION | ORAL | Status: DC | PRN
  Filled 2020-10-23: qty 15

## 2020-10-23 MED ORDER — CLINDAMYCIN PHOSPHATE 300 MG/50ML IV SOLN: 300.0000 mg | Freq: Once | INTRAVENOUS | Status: AC

## 2020-10-23 MED ORDER — ASPIRIN 81 MG PO CHEW
CHEWABLE_TABLET | ORAL | Status: AC
  Administered 2020-10-23: 81 mg via ORAL
  Filled 2020-10-23: qty 1

## 2020-10-23 MED ORDER — FENTANYL CITRATE (PF) 100 MCG/2ML IJ SOLN
INTRAMUSCULAR | Status: AC
  Filled 2020-10-23: qty 2

## 2020-10-23 MED ORDER — CLOPIDOGREL BISULFATE 75 MG PO TABS
75.0000 mg | ORAL_TABLET | Freq: Every day | ORAL | Status: DC
  Filled 2020-10-23: qty 1

## 2020-10-23 MED ORDER — HYDRALAZINE HCL 20 MG/ML IJ SOLN: 5.0000 mg | INTRAMUSCULAR | Status: DC | PRN

## 2020-10-23 MED ORDER — CLINDAMYCIN PHOSPHATE 300 MG/50ML IV SOLN: 300.0000 mg | Freq: Three times a day (TID) | INTRAVENOUS | Status: DC

## 2020-10-23 MED ORDER — ONDANSETRON HCL 4 MG/2ML IJ SOLN: 4.0000 mg | Freq: Four times a day (QID) | INTRAMUSCULAR | Status: DC | PRN

## 2020-10-23 MED ORDER — SODIUM CHLORIDE 0.9 % IV SOLN: 500.0000 mL | Freq: Once | INTRAVENOUS | Status: DC | PRN

## 2020-10-23 MED ORDER — HEPARIN SODIUM (PORCINE) 1000 UNIT/ML IJ SOLN
INTRAMUSCULAR | Status: AC
  Filled 2020-10-23: qty 1

## 2020-10-23 MED ORDER — METHYLPREDNISOLONE SODIUM SUCC 125 MG IJ SOLR: 125.0000 mg | Freq: Once | INTRAMUSCULAR | Status: DC | PRN

## 2020-10-23 MED ORDER — METOPROLOL TARTRATE 5 MG/5ML IV SOLN: 2.0000 mg | INTRAVENOUS | Status: DC | PRN

## 2020-10-23 MED ORDER — HEPARIN SODIUM (PORCINE) 1000 UNIT/ML IJ SOLN
INTRAMUSCULAR | Status: DC | PRN
  Administered 2020-10-23: 8000 [IU] via INTRAVENOUS
  Administered 2020-10-23: 2000 [IU] via INTRAVENOUS

## 2020-10-23 MED ORDER — IODIXANOL 320 MG/ML IV SOLN
INTRAVENOUS | Status: DC | PRN
  Administered 2020-10-23: 55 mL via INTRA_ARTERIAL

## 2020-10-23 MED ORDER — ATORVASTATIN CALCIUM 80 MG PO TABS
80.0000 mg | ORAL_TABLET | Freq: Every day | ORAL | Status: DC
  Filled 2020-10-23: qty 1

## 2020-10-23 SURGICAL SUPPLY — 23 items
BALLN VIATRAC 4X20X135 (BALLOONS) ×3
BALLN VIATRAC 4X30X135 (BALLOONS) ×3
BALLOON VIATRAC 4X20X135 (BALLOONS) IMPLANT
BALLOON VIATRAC 4X30X135 (BALLOONS) IMPLANT
CATH ANGIO 5F PIGTAIL 100CM (CATHETERS) ×2 IMPLANT
CATH BEACON 5 .035 100 H1 TIP (CATHETERS) ×2 IMPLANT
COVER PROBE U/S 5X48 (MISCELLANEOUS) ×2 IMPLANT
DEVICE EMBOSHIELD NAV6 4.0-7.0 (FILTER) ×2 IMPLANT
DEVICE SAFEGUARD 24CM (GAUZE/BANDAGES/DRESSINGS) ×2 IMPLANT
DEVICE STARCLOSE SE CLOSURE (Vascular Products) ×2 IMPLANT
DEVICE TORQUE .025-.038 (MISCELLANEOUS) ×2 IMPLANT
GLIDEWIRE ANGLED SS 035X260CM (WIRE) ×2 IMPLANT
KIT CAROTID MANIFOLD (MISCELLANEOUS) ×2 IMPLANT
KIT ENCORE 26 ADVANTAGE (KITS) ×2 IMPLANT
NDL ENTRY 21GA 7CM ECHOTIP (NEEDLE) IMPLANT
NEEDLE ENTRY 21GA 7CM ECHOTIP (NEEDLE) ×3 IMPLANT
PACK ANGIOGRAPHY (CUSTOM PROCEDURE TRAY) ×3 IMPLANT
SET INTRO CAPELLA COAXIAL (SET/KITS/TRAYS/PACK) ×2 IMPLANT
SHEATH BRITE TIP 6FRX11 (SHEATH) ×2 IMPLANT
SHEATH SHUTTLE 6FR (SHEATH) ×2 IMPLANT
STENT XACT CAR 9-7X40X136 (Permanent Stent) ×2 IMPLANT
WIRE G VAS 035X260 STIFF (WIRE) ×2 IMPLANT
WIRE J 3MM .035X145CM (WIRE) ×2 IMPLANT

## 2020-10-23 NOTE — Progress Notes (Signed)
Patient arrived to unit via wheelchair. Voided, denies any c/o's. Gait steady, does use a cane at home. Lungs CTA, heart sounds regular. No resp distress noted.  Neuro status intact. RUE grasp slightly weaker then LUE. Verbalizes undertanding of procedure.  Daughter at bedside.

## 2020-10-23 NOTE — Op Note (Signed)
OPERATIVE NOTE DATE: 10/23/2020  PROCEDURE: 1.  Ultrasound guidance for vascular access right common femoral artery 2.  Placement of a 9 x 7 x 40 exact stent with the use of the NAV-6 embolic protection device in the right internal carotid artery  PRE-OPERATIVE DIAGNOSIS: 1.  Greater than 95% stenosis carotid artery stenosis. 2.  History of TIA  POST-OPERATIVE DIAGNOSIS:  Same as above  SURGEON: Hortencia Pilar  ASSISTANT(S): Latoya Huxley, MD  ANESTHESIA: local/MCS  ESTIMATED BLOOD LOSS: 100 cc  CONTRAST: 55 cc  FLUORO TIME: 4.1 minutes  MODERATE CONSCIOUS SEDATION TIME: Continuous ECG pulse oximetry and cardiopulmonary monitoring was performed throughout the entire procedure by the interventional radiology nurse total sedation time was 36 minutes and 52 seconds.  FINDING(S): 1.   Greater than 95% stenosis of the right internal carotid artery, also noted is a 50 to 60% stenosis in the siphon of the right internal carotid artery.  SPECIMEN(S):   none  INDICATIONS:   Patient is a 69 y.o. female who presents with greater 95% stenosis of the right internal carotid artery stenosis.  The patient has a high bifurcation as well as significant degenerative disease of the cervical spine in association with a TIA and carotid artery stenting was felt to be preferred to endarterectomy for that reason.  Risks and benefits were discussed and informed consent was obtained.   DESCRIPTION: After obtaining full informed written consent, the patient was brought back to the vascular suite and placed supine upon the table.  The patient received IV antibiotics prior to induction. Moderate conscious sedation was administered during a face to face encounter with the patient throughout the procedure with my supervision of the RN administering medicines and monitoring the patients vital signs and mental status throughout from the start of the procedure until the patient was taken to the recovery room.     After obtaining adequate sedation, the patient was prepped and draped in the standard fashion.    A first assistant is required in order to allow for a safe and more efficient operation.  Duties include wire manipulations as well as assistance with pinning the sheath and positioning the detector for proper angle, assistance and deploying the stent in the proper position and appropriate images.  Further duties include assisting with patient positioning during the procedure.  I believe that this procedure requires a first assistant in order for it to be performed at a level in keeping with the high standards of this institution.  The right femoral artery was visualized with ultrasound and found to be widely patent. It was then accessed under direct ultrasound guidance without difficulty with a micropuncture needle. A permanent image was recorded.  A microwire was then advanced without difficulty under fluoroscopic guidance followed by a micro-sheath.  A J-wire was placed and we then placed a 6 French sheath. The patient was then heparinized and a total of 10,000 units of intravenous heparin were given and an ACT was checked to confirm successful anticoagulation.   A pigtail catheter was then placed into the ascending aorta. This showed type I arch with bovine anatomy. The innominate artery was then selectively cannulated without difficulty with a H1 catheter and the catheter advanced into the mid right common carotid artery.  Cervical and cerebral carotid angiography was then performed. There were no obvious intracranial filling defects. The carotid bifurcation demonstrated patent external carotid artery with a greater than 95% stenosis of the proximal internal carotid artery.  I then advanced  into the external carotid artery with a Glidewire and the H1 catheter and then the stiff angle glide wire was exchanged for the Amplatz Super Stiff wire. Over the Amplatz Super Stiff wire, a 6 [place] shuttle sheath was  placed into the mid common carotid artery. I then used the NAV-6  Embolic protection device and crossed the lesion and parked this in the distal internal carotid artery at the base of the skull.  I then selected a 9 mm x 7 mm x 40 mm exact stent. This was deployed across the lesion encompassing it in its entirety. A 4 mm x 30 mm length balloon was used to post dilate the stent. Only about a 10% residual stenosis was present after angioplasty. Completion angiogram showed normal intracranial filling without new defects. At this point I elected to terminate the procedure. The sheath was removed and StarClose closure device was deployed in the right femoral artery with excellent hemostatic result. The patient was taken to the recovery room in stable condition having tolerated the procedure well.  COMPLICATIONS: none  CONDITION: stable  Hortencia Pilar 10/23/2020 5:45 PM   This note was created with Dragon Medical transcription system. Any errors in dictation are purely unintentional.

## 2020-10-23 NOTE — Interval H&P Note (Signed)
History and Physical Interval Note:  10/23/2020 4:27 PM  Latoya Herman  has presented today for surgery, with the diagnosis of RT carotid stent   ABBOTT    Carotid stenosis.  The various methods of treatment have been discussed with the patient and family. After consideration of risks, benefits and other options for treatment, the patient has consented to  Procedure(s): CAROTID PTA/STENT INTERVENTION (Right) as a surgical intervention.  The patient's history has been reviewed, patient examined, no change in status, stable for surgery.  I have reviewed the patient's chart and labs.  Questions were answered to the patient's satisfaction.     Hortencia Pilar

## 2020-10-23 NOTE — Progress Notes (Signed)
Purewick placed on pt. 

## 2020-10-23 NOTE — OR Nursing (Signed)
Pt unable to void with purewick. Dr schnier notified. Plan to in and out cath. But pt requested foley placemnt for overnight since she is on bedrest until am. BP in 80's after sat up to void. Fluid bolus started per order.

## 2020-10-24 ENCOUNTER — Encounter: Payer: Self-pay | Admitting: Vascular Surgery

## 2020-10-24 DIAGNOSIS — I6521 Occlusion and stenosis of right carotid artery: Secondary | ICD-10-CM | POA: Diagnosis not present

## 2020-10-24 LAB — CBC
HCT: 23.8 % — ABNORMAL LOW (ref 36.0–46.0)
Hemoglobin: 7.9 g/dL — ABNORMAL LOW (ref 12.0–15.0)
MCH: 30 pg (ref 26.0–34.0)
MCHC: 33.2 g/dL (ref 30.0–36.0)
MCV: 90.5 fL (ref 80.0–100.0)
Platelets: 291 10*3/uL (ref 150–400)
RBC: 2.63 MIL/uL — ABNORMAL LOW (ref 3.87–5.11)
RDW: 13.2 % (ref 11.5–15.5)
WBC: 7.6 10*3/uL (ref 4.0–10.5)
nRBC: 0 % (ref 0.0–0.2)

## 2020-10-24 LAB — BASIC METABOLIC PANEL
Anion gap: 6 (ref 5–15)
BUN: 12 mg/dL (ref 8–23)
CO2: 24 mmol/L (ref 22–32)
Calcium: 8.2 mg/dL — ABNORMAL LOW (ref 8.9–10.3)
Chloride: 107 mmol/L (ref 98–111)
Creatinine, Ser: 0.87 mg/dL (ref 0.44–1.00)
GFR, Estimated: >60 mL/min (ref >60)
Glucose, Bld: 103 mg/dL — ABNORMAL HIGH (ref 70–99)
Potassium: 3.7 mmol/L (ref 3.5–5.1)
Sodium: 137 mmol/L (ref 135–145)

## 2020-10-24 LAB — MAGNESIUM: Magnesium: 1.8 mg/dL (ref 1.7–2.4)

## 2020-10-24 LAB — GLUCOSE, CAPILLARY
Glucose-Capillary: 141 mg/dL — ABNORMAL HIGH (ref 70–99)
Glucose-Capillary: 109 mg/dL — ABNORMAL HIGH (ref 70–99)

## 2020-10-24 LAB — POCT ACTIVATED CLOTTING TIME: Activated Clotting Time: 252 seconds

## 2020-10-24 MED ORDER — INSULIN ASPART 100 UNIT/ML ~~LOC~~ SOLN
SUBCUTANEOUS | Status: AC
  Filled 2020-10-24: qty 1

## 2020-10-24 MED ORDER — ASPIRIN 81 MG PO CHEW
CHEWABLE_TABLET | ORAL | Status: AC
  Administered 2020-10-24: 81 mg via ORAL
  Filled 2020-10-24: qty 1

## 2020-10-24 MED ORDER — ONDANSETRON HCL 4 MG/2ML IJ SOLN
INTRAMUSCULAR | Status: AC
  Filled 2020-10-24: qty 2

## 2020-10-24 MED ORDER — CARVEDILOL 25 MG PO TABS
ORAL_TABLET | ORAL | Status: AC
  Administered 2020-10-24: 25 mg via ORAL
  Filled 2020-10-24: qty 1

## 2020-10-24 MED ORDER — CLINDAMYCIN PHOSPHATE 300 MG/50ML IV SOLN
INTRAVENOUS | Status: AC
  Administered 2020-10-24: 300 mg via INTRAVENOUS
  Filled 2020-10-24: qty 50

## 2020-10-24 NOTE — Progress Notes (Signed)
Nsg Discharge Note  Admit Date:  10/23/2020 Discharge date: 10/24/2020   Latoya Herman to be D/C'd Home per MD order.  AVS completed.   Discharge Medication: Allergies as of 10/24/2020      Reactions   Penicillins Swelling, Other (See Comments)   Lisinopril Rash   Possible rash      Medication List    TAKE these medications   aspirin 81 MG chewable tablet Chew 1 tablet (81 mg total) by mouth daily.   atorvastatin 80 MG tablet Commonly known as: LIPITOR Take 80 mg by mouth daily.   carvedilol 25 MG tablet Commonly known as: COREG Take 25 mg by mouth 2 (two) times daily with a meal.   clopidogrel 75 MG tablet Commonly known as: PLAVIX Take 75 mg by mouth daily at 12 noon.   gabapentin 100 MG capsule Commonly known as: NEURONTIN Take 100 mg by mouth at bedtime.   levothyroxine 75 MCG tablet Commonly known as: SYNTHROID Take 75 mcg by mouth daily before breakfast.   spironolactone 25 MG tablet Commonly known as: ALDACTONE Take 25 mg by mouth daily.       Discharge Assessment: Vitals:   10/24/20 1000 10/24/20 1553  BP: 126/61 117/80  Pulse: 72 80  Resp: (!) 23 18  Temp:  98.3 F (36.8 C)  SpO2: 93% 100%   Skin clean, dry and intact without evidence of skin break down, no evidence of skin tears noted. IV catheter discontinued intact. Site without signs and symptoms of complications - no redness or edema noted at insertion site, patient denies c/o pain - only slight tenderness at site.  Dressing with slight pressure applied.  D/c Instructions-Education: Discharge instructions given to patient/family with verbalized understanding. D/c education completed with patient/family including follow up instructions, medication list, d/c activities limitations if indicated, with other d/c instructions as indicated by MD - patient able to verbalize understanding, all questions fully answered. Patient instructed to return to ED, call 911, or call MD for any changes in  condition.  Patient escorted via [place], and D/C home via private auto.  Tresa Endo, RN 10/24/2020 4:08 PM

## 2020-10-24 NOTE — Discharge Summary (Signed)
[facility]    Discharge Summary  Patient ID:  Latoya Herman MRN: [id number] DOB/AGE: 71/23/1952 71 y.o.  Admit date: 10/23/2020 Discharge date: 10/24/2020 Date of Surgery: 10/23/2020 Surgeon: Surgeon(s): Schnier, Dolores Lory, MD  Admission Diagnosis: Symptomatic carotid artery stenosis without infarction [I65.29]  Discharge Diagnoses:  Symptomatic carotid artery stenosis without infarction [I65.29]  Secondary Diagnoses: Past Medical History:  Diagnosis Date  . Chronic kidney disease   . Coronary artery disease   . CVA (cerebral vascular accident) ([place]) 09/21/2020  . Diabetes mellitus without complication ([place])   . Hyperlipidemia   . Hypertension   . SOBOE (shortness of breath on exertion)    Procedure(s): 10/23/20: 1.  Ultrasound guidance for vascular access right common femoral artery 2.  Placement of a 9 x 7 x 40 exact stent with the use of the NAV-6 embolic protection device in the right internal carotid artery  Discharged Condition: Good  HPI / Hospital Course:  Patient is a 69 y.o. female who presents with greater 95% stenosis of the right internal carotid artery stenosis.  The patient has a high bifurcation as well as significant degenerative disease of the cervical spine in association with a TIA and carotid artery stenting was felt to be preferred to endarterectomy for that reason.  Risks and benefits were discussed and informed consent was obtained.  On November 10th, 2021 the patient underwent:  3.  Ultrasound guidance for vascular access right common femoral artery 4.  Placement of a 9 x 7 x 40 exact stent with the use of the NAV-6 embolic protection device in the right internal carotid artery  The patient had a procedure well was transferred from the angiography suite to the recovery room for observation overnight.  Patient's night of surgery was unremarkable.  During the patient's brief inpatient stay, her diet was advanced, her Foley  was removed, her pain was controlled the use of p.o. pain medication she was ambulating at baseline.  Day of discharge, the patient was afebrile with stable vital signs and essentially unremarkable physical exam  Physical exam:  Right and oriented x3, no acute distress Face: Symmetrical, tongue midline Neck: Midline, no swelling or bruising noted Cardiovascular: Rate and rhythm Pulmonary: Auscultation bilaterally Abdomen: Soft, nontender, nondistended positive bowel sounds Access site: Clean dry intact, minimal ecchymosis no swelling Extremity: Warm distally to toes Neuro: No deficits noted on exam 5/5 upper/lower strength equal  Labs: As below  Complications: None  Consults: None  Significant Diagnostic Studies: CBC Lab Results  Component Value Date   WBC 7.6 10/24/2020   HGB 7.9 (L) 10/24/2020   HCT 23.8 (L) 10/24/2020   MCV 90.5 10/24/2020   PLT 291 10/24/2020   BMET    Component Value Date/Time   NA 137 10/24/2020 0641   NA 134 (L) 10/20/2013 0130   K 3.7 10/24/2020 0641   K 4.1 10/20/2013 0130   CL 107 10/24/2020 0641   CL 102 10/20/2013 0130   CO2 24 10/24/2020 0641   CO2 25 10/20/2013 0130   GLUCOSE 103 (H) 10/24/2020 0641   GLUCOSE 238 (H) 10/20/2013 0130   BUN 12 10/24/2020 0641   BUN 14 10/20/2013 0130   CREATININE 0.87 10/24/2020 0641   CREATININE 0.91 10/20/2013 0130   CALCIUM 8.2 (L) 10/24/2020 0641   CALCIUM 8.6 10/20/2013 0130   GFRNONAA >60 10/24/2020 0641   GFRNONAA >60 10/20/2013 0130   GFRAA >60 10/20/2013 0130   COAG Lab Results  Component Value Date  INR 0.9 09/22/2020   Disposition:  Discharge to :Home  Allergies as of 10/24/2020      Reactions   Penicillins Swelling, Other (See Comments)   Lisinopril Rash   Possible rash      Medication List    TAKE these medications   aspirin 81 MG chewable tablet Chew 1 tablet (81 mg total) by mouth daily.   atorvastatin 80 MG tablet Commonly known as: LIPITOR Take 80 mg by mouth  daily.   carvedilol 25 MG tablet Commonly known as: COREG Take 25 mg by mouth 2 (two) times daily with a meal.   clopidogrel 75 MG tablet Commonly known as: PLAVIX Take 75 mg by mouth daily at 12 noon.   gabapentin 100 MG capsule Commonly known as: NEURONTIN Take 100 mg by mouth at bedtime.   levothyroxine 75 MCG tablet Commonly known as: SYNTHROID Take 75 mcg by mouth daily before breakfast.   spironolactone 25 MG tablet Commonly known as: ALDACTONE Take 25 mg by mouth daily.      Verbal and written Discharge instructions given to the patient. Wound care per Discharge AVS  Follow-up Information    Schnier, Dolores Lory, MD Follow up in 1 month(s).   Specialties: Vascular Surgery, Cardiology, Radiology, Vascular Surgery Why: Can see Schnier or Arna Medici.  Will need carotid duplex with visit. Contact information: [place] [address] [phone]              Signed: Sela Hua, PA-C  10/24/2020, 11:12 AM

## 2020-10-24 NOTE — Progress Notes (Signed)
1050:  65ml of air removed from PAD. Site had some old drainage.  PA notified and stated was fine to continue with deflation.   1205:  60ml of air removed from PAD. No changes to site. Old drainage present. Patient tolerated well.   1310:  98ml of air removed from PAD. No changes to site. Old drainage present. Patient tolerated well.   1415:  69ml of air removed from PAD. No changes to site. Old drainage present. Patient tolerated well.   1516:  PAD device removed. Cleaned site with warm washcloth to remove old drainage. Guaze dressing applied to site with paper tape.  Patient tolerated well. No new bleeding noted.

## 2020-10-24 NOTE — Discharge Instructions (Signed)
Vascular Surgery Discharge Instructions:  1) you may shower as of tomorrow.  Please keep your groins clean and dry.  Gently clean your groins with soap and water.  Gently pat dry. 2) please do not engage in strenuous activity or lifting greater than 10 pounds until you are cleared at your postoperative follow-up visit.

## 2020-10-24 NOTE — Progress Notes (Signed)
Pt rolled over while sleeping. Small amount of bleeding from the safeguard device. Does not appear to be actively bleeding at present. Notified Dr. Delana Meyer. Per Dr. Delana Meyer nurse to continue to monitor for bleeding. No further orders at this time.

## 2020-11-09 ENCOUNTER — Telehealth: Payer: Self-pay | Admitting: Surgery

## 2020-11-09 NOTE — Telephone Encounter (Signed)
Patient is recently status post right carotid stent for a prior stroke.  She states that in the post operative period, she complained of right sided facial numbness, bilateral eye lid drooping,  and a discomfort behind her eye.  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  She is not sure when this began in the post operative period, it could have been immediately after surgery, as she has not been able to read since she got home.  I proposed 2 options, either coming to the ER for additional testing to evaluate her stent or coming to the office on Monday for a duplex.  Due to her concerns, I have recommended that she come to the ER for further evaluation.   Annamarie Major

## 2020-11-10 ENCOUNTER — Observation Stay
Admission: EM | Admit: 2020-11-10 | Discharge: 2020-11-11 | Disposition: A | Discharge status: Home / Self Care | Payer: Medicare HMO | Attending: Internal Medicine | Admitting: Internal Medicine

## 2020-11-10 ENCOUNTER — Other Ambulatory Visit: Payer: Self-pay

## 2020-11-10 ENCOUNTER — Observation Stay: Payer: Medicare HMO

## 2020-11-10 DIAGNOSIS — Z79899 Other long term (current) drug therapy: Secondary | ICD-10-CM | POA: Diagnosis not present

## 2020-11-10 DIAGNOSIS — I2510 Atherosclerotic heart disease of native coronary artery without angina pectoris: Secondary | ICD-10-CM | POA: Diagnosis not present

## 2020-11-10 DIAGNOSIS — I639 Cerebral infarction, unspecified: Secondary | ICD-10-CM | POA: Insufficient documentation

## 2020-11-10 DIAGNOSIS — I10 Essential (primary) hypertension: Secondary | ICD-10-CM | POA: Diagnosis not present

## 2020-11-10 DIAGNOSIS — Z23 Encounter for immunization: Secondary | ICD-10-CM | POA: Diagnosis not present

## 2020-11-10 DIAGNOSIS — H539 Unspecified visual disturbance: Secondary | ICD-10-CM | POA: Diagnosis not present

## 2020-11-10 DIAGNOSIS — H3411 Central retinal artery occlusion, right eye: Secondary | ICD-10-CM | POA: Diagnosis not present

## 2020-11-10 DIAGNOSIS — Z7982 Long term (current) use of aspirin: Secondary | ICD-10-CM | POA: Insufficient documentation

## 2020-11-10 DIAGNOSIS — I1310 Hypertensive heart and chronic kidney disease without heart failure, with stage 1 through stage 4 chronic kidney disease, or unspecified chronic kidney disease: Secondary | ICD-10-CM | POA: Diagnosis not present

## 2020-11-10 DIAGNOSIS — N183 Chronic kidney disease, stage 3 unspecified: Secondary | ICD-10-CM | POA: Insufficient documentation

## 2020-11-10 DIAGNOSIS — E039 Hypothyroidism, unspecified: Secondary | ICD-10-CM | POA: Diagnosis not present

## 2020-11-10 DIAGNOSIS — R2689 Other abnormalities of gait and mobility: Secondary | ICD-10-CM | POA: Insufficient documentation

## 2020-11-10 DIAGNOSIS — Z20822 Contact with and (suspected) exposure to covid-19: Secondary | ICD-10-CM | POA: Insufficient documentation

## 2020-11-10 DIAGNOSIS — E119 Type 2 diabetes mellitus without complications: Secondary | ICD-10-CM | POA: Insufficient documentation

## 2020-11-10 LAB — BASIC METABOLIC PANEL
Anion gap: 9 (ref 5–15)
BUN: 21 mg/dL (ref 8–23)
CO2: 23 mmol/L (ref 22–32)
Calcium: 9.3 mg/dL (ref 8.9–10.3)
Chloride: 102 mmol/L (ref 98–111)
Creatinine, Ser: 0.98 mg/dL (ref 0.44–1.00)
GFR, Estimated: >60 mL/min (ref >60)
Glucose, Bld: 151 mg/dL — ABNORMAL HIGH (ref 70–99)
Potassium: 4.5 mmol/L (ref 3.5–5.1)
Sodium: 134 mmol/L — ABNORMAL LOW (ref 135–145)

## 2020-11-10 LAB — CBC WITH DIFFERENTIAL/PLATELET
Abs Immature Granulocytes: 0.03 10*3/uL (ref 0.00–0.07)
Basophils Absolute: 0.1 10*3/uL (ref 0.0–0.1)
Basophils Relative: 1 %
Eosinophils Absolute: 0.2 10*3/uL (ref 0.0–0.5)
Eosinophils Relative: 3 %
HCT: 31.4 % — ABNORMAL LOW (ref 36.0–46.0)
Hemoglobin: 10.4 g/dL — ABNORMAL LOW (ref 12.0–15.0)
Immature Granulocytes: 0 %
Lymphocytes Relative: 18 %
Lymphs Abs: 1.5 10*3/uL (ref 0.7–4.0)
MCH: 29.5 pg (ref 26.0–34.0)
MCHC: 33.1 g/dL (ref 30.0–36.0)
MCV: 89.2 fL (ref 80.0–100.0)
Monocytes Absolute: 0.4 10*3/uL (ref 0.1–1.0)
Monocytes Relative: 5 %
Neutro Abs: 5.8 10*3/uL (ref 1.7–7.7)
Neutrophils Relative %: 73 %
Platelets: 443 10*3/uL — ABNORMAL HIGH (ref 150–400)
RBC: 3.52 MIL/uL — ABNORMAL LOW (ref 3.87–5.11)
RDW: 13.2 % (ref 11.5–15.5)
WBC: 8 10*3/uL (ref 4.0–10.5)
nRBC: 0 % (ref 0.0–0.2)

## 2020-11-10 LAB — RESP PANEL BY RT-PCR (FLU A&B, COVID) ARPGX2
Influenza A by PCR: NEGATIVE
Influenza B by PCR: NEGATIVE
SARS Coronavirus 2 by RT PCR: NEGATIVE

## 2020-11-10 LAB — CREATININE, SERUM
Creatinine, Ser: 0.94 mg/dL (ref 0.44–1.00)
GFR, Estimated: >60 mL/min (ref >60)

## 2020-11-10 LAB — CBC
HCT: 31.5 % — ABNORMAL LOW (ref 36.0–46.0)
Hemoglobin: 10.3 g/dL — ABNORMAL LOW (ref 12.0–15.0)
MCH: 29.5 pg (ref 26.0–34.0)
MCHC: 32.7 g/dL (ref 30.0–36.0)
MCV: 90.3 fL (ref 80.0–100.0)
Platelets: 402 10*3/uL — ABNORMAL HIGH (ref 150–400)
RBC: 3.49 MIL/uL — ABNORMAL LOW (ref 3.87–5.11)
RDW: 13.2 % (ref 11.5–15.5)
WBC: 8.5 10*3/uL (ref 4.0–10.5)
nRBC: 0 % (ref 0.0–0.2)

## 2020-11-10 LAB — C-REACTIVE PROTEIN: CRP: 0.7 mg/dL (ref <1.0)

## 2020-11-10 LAB — CBG MONITORING, ED: Glucose-Capillary: 109 mg/dL — ABNORMAL HIGH (ref 70–99)

## 2020-11-10 LAB — SEDIMENTATION RATE: Sed Rate: 58 mm/hr — ABNORMAL HIGH (ref 0–30)

## 2020-11-10 LAB — GLUCOSE, CAPILLARY: Glucose-Capillary: 120 mg/dL — ABNORMAL HIGH (ref 70–99)

## 2020-11-10 MED ORDER — ASPIRIN 81 MG PO CHEW: 81.0000 mg | CHEWABLE_TABLET | Freq: Every day | ORAL | Status: DC

## 2020-11-10 MED ORDER — CARVEDILOL 25 MG PO TABS
25.0000 mg | ORAL_TABLET | Freq: Two times a day (BID) | ORAL | Status: DC
  Administered 2020-11-10 – 2020-11-11 (×2): 25 mg via ORAL
  Filled 2020-11-10 (×2): qty 1

## 2020-11-10 MED ORDER — PNEUMOCOCCAL VAC POLYVALENT 25 MCG/0.5ML IJ INJ
0.5000 mL | INJECTION | INTRAMUSCULAR | Status: AC
  Administered 2020-11-11: 0.5 mL via INTRAMUSCULAR
  Filled 2020-11-10: qty 0.5

## 2020-11-10 MED ORDER — ONDANSETRON HCL 4 MG PO TABS: 4.0000 mg | ORAL_TABLET | Freq: Four times a day (QID) | ORAL | Status: DC | PRN

## 2020-11-10 MED ORDER — CLOPIDOGREL BISULFATE 75 MG PO TABS
75.0000 mg | ORAL_TABLET | Freq: Every day | ORAL | Status: DC
  Administered 2020-11-11: 75 mg via ORAL
  Filled 2020-11-10: qty 1

## 2020-11-10 MED ORDER — INSULIN ASPART 100 UNIT/ML ~~LOC~~ SOLN
0.0000 [IU] | Freq: Three times a day (TID) | SUBCUTANEOUS | Status: DC
  Administered 2020-11-11: 1 [IU] via SUBCUTANEOUS
  Filled 2020-11-10: qty 1

## 2020-11-10 MED ORDER — SPIRONOLACTONE 25 MG PO TABS
25.0000 mg | ORAL_TABLET | Freq: Every day | ORAL | Status: DC
  Administered 2020-11-11: 25 mg via ORAL
  Filled 2020-11-10: qty 1

## 2020-11-10 MED ORDER — ATORVASTATIN CALCIUM 20 MG PO TABS
80.0000 mg | ORAL_TABLET | Freq: Every day | ORAL | Status: DC
  Administered 2020-11-10 – 2020-11-11 (×2): 80 mg via ORAL
  Filled 2020-11-10 (×2): qty 4

## 2020-11-10 MED ORDER — SENNOSIDES-DOCUSATE SODIUM 8.6-50 MG PO TABS: 1.0000 | ORAL_TABLET | Freq: Every evening | ORAL | Status: DC | PRN

## 2020-11-10 MED ORDER — GABAPENTIN 100 MG PO CAPS
100.0000 mg | ORAL_CAPSULE | Freq: Every day | ORAL | Status: DC
  Administered 2020-11-10: 100 mg via ORAL
  Filled 2020-11-10: qty 1

## 2020-11-10 MED ORDER — LEVOTHYROXINE SODIUM 50 MCG PO TABS
75.0000 ug | ORAL_TABLET | Freq: Every day | ORAL | Status: DC
  Administered 2020-11-11: 75 ug via ORAL
  Filled 2020-11-10: qty 2

## 2020-11-10 MED ORDER — INSULIN ASPART 100 UNIT/ML ~~LOC~~ SOLN: 0.0000 [IU] | Freq: Every day | SUBCUTANEOUS | Status: DC

## 2020-11-10 MED ORDER — ONDANSETRON HCL 4 MG/2ML IJ SOLN: 4.0000 mg | Freq: Four times a day (QID) | INTRAMUSCULAR | Status: DC | PRN

## 2020-11-10 MED ORDER — ACETAMINOPHEN 650 MG RE SUPP: 650.0000 mg | Freq: Four times a day (QID) | RECTAL | Status: DC | PRN

## 2020-11-10 MED ORDER — ENOXAPARIN SODIUM 40 MG/0.4ML ~~LOC~~ SOLN
40.0000 mg | SUBCUTANEOUS | Status: DC
  Administered 2020-11-10: 40 mg via SUBCUTANEOUS
  Filled 2020-11-10: qty 0.4

## 2020-11-10 MED ORDER — ACETAMINOPHEN 325 MG PO TABS: 650.0000 mg | ORAL_TABLET | Freq: Four times a day (QID) | ORAL | Status: DC | PRN

## 2020-11-10 MED ORDER — HYDRALAZINE HCL 20 MG/ML IJ SOLN
10.0000 mg | Freq: Four times a day (QID) | INTRAMUSCULAR | Status: DC | PRN
  Administered 2020-11-10: 10 mg via INTRAVENOUS
  Filled 2020-11-10: qty 1

## 2020-11-10 NOTE — ED Provider Notes (Signed)
[facility] Emergency Department Provider Note   ____________________________________________    I have reviewed the triage vital signs and the nursing notes.   HISTORY  Chief Complaint Post-op Problem     HPI Latoya Herman is a 69 y.o. female with history of diabetes, hyperlipidemia, CVA, CAD, CKD who presents with complaints of blindness in the right eye.  Patient notes since having carotid stent placed nearly 2 weeks ago she has had visual difficulties with her right eye, she thought it would get better after the anesthesia wore off however she became concerned when she realized yesterday that she is unable to see out of her right eye at all.  Past Medical History:  Diagnosis Date  . Chronic kidney disease   . Coronary artery disease   . CVA (cerebral vascular accident) ([place]) 09/21/2020  . Diabetes mellitus without complication ([place])   . Hyperlipidemia   . Hypertension   . SOBOE (shortness of breath on exertion)     Patient Active Problem List   Diagnosis Date Noted  . Vision disturbance 11/10/2020  . Cerebrovascular accident (CVA) ([place])   . Symptomatic carotid artery stenosis without infarction 10/23/2020  . Carotid stenosis 10/07/2020  . Ataxia S/P CVA 09/30/2020  . Acute ischemic stroke ([place]) 09/22/2020  . Hypothyroidism 09/21/2020  . Coronary artery disease   . Hypertension   . Hyperlipidemia   . Diabetes mellitus without complication ([place])   . Elevated troponin   . Leukocytosis   . Near syncope   . SOBOE (shortness of breath on exertion) 10/02/2019  . Acquired hypothyroidism 01/17/2018  . Healthcare maintenance 09/08/2017  . Benign essential hypertension 06/26/2015  . Valvular heart disease 01/11/2015  . Coronary artery disease involving native coronary artery of native heart without angina pectoris 01/11/2015  . Type 2 diabetes mellitus with stage 3 chronic kidney disease, without long-term current use of insulin ([place])  01/11/2015  . Hyperlipidemia, mixed 01/11/2015    Past Surgical History:  Procedure Laterality Date  . CAROTID PTA/STENT INTERVENTION Right 10/23/2020   Procedure: CAROTID PTA/STENT INTERVENTION;  Surgeon: Katha Cabal, MD;  Location: [facility];  Service: Cardiovascular;  Laterality: Right;  . COLONOSCOPY  04/23/2010  . COLONOSCOPY WITH ESOPHAGOGASTRODUODENOSCOPY (EGD)  04/23/2010  . COLONOSCOPY WITH PROPOFOL N/A 08/23/2020   Procedure: COLONOSCOPY WITH PROPOFOL;  Surgeon: Lesly Rubenstein, MD;  Location: [facility] ENDOS[facility];  Service: Endoscopy;  Laterality: N/A;  . CORONARY ANGIOPLASTY WITH STENT PLACEMENT  10/19/2013  . SUPERFICIAL LYMPH NODE BIOPSY / EXCISION  2013    Prior to Admission medications   Medication Sig Start Date End Date Taking? Authorizing Provider  aspirin 81 MG chewable tablet Chew 1 tablet (81 mg total) by mouth daily. 09/24/20   Lorella Nimrod, MD  atorvastatin (LIPITOR) 80 MG tablet Take 80 mg by mouth daily.    [provider]  carvedilol (COREG) 25 MG tablet Take 25 mg by mouth 2 (two) times daily with a meal.    [provider]  clopidogrel (PLAVIX) 75 MG tablet Take 75 mg by mouth daily at 12 noon.     [provider]  gabapentin (NEURONTIN) 100 MG capsule Take 100 mg by mouth at bedtime.     [provider]  levothyroxine (SYNTHROID) 75 MCG tablet Take 75 mcg by mouth daily before breakfast.    [provider]  spironolactone (ALDACTONE) 25 MG tablet Take 25 mg by mouth daily.    [provider]  Allergies Penicillins and Lisinopril  Family History  Problem Relation Age of Onset  . Breast cancer Neg Hx     Social History Social History   Tobacco Use  . Smoking status: Never Smoker  . Smokeless tobacco: Never Used  Vaping Use  . Vaping Use: Never used  Substance Use Topics  . Alcohol use: Never  . Drug use: Never    Review of Systems  Constitutional: No  fever/chills Eyes: As above ENT: No sore throat. Cardiovascular: Denies chest pain. Respiratory: Denies shortness of breath. Gastrointestinal: No abdominal pain.  No nausea, no vomiting.   Genitourinary: Negative for dysuria. Musculoskeletal: Negative for back pain. Skin: Negative for rash. Neurological: As above   ____________________________________________   PHYSICAL EXAM:  VITAL SIGNS: ED Triage Vitals  Enc Vitals Group     BP 11/10/20 1508 (!) 200/105     Pulse Rate 11/10/20 1503 81     Resp 11/10/20 1503 18     Temp 11/10/20 1503 97.7 F (36.5 C)     Temp Source 11/10/20 1503 Oral     SpO2 11/10/20 1503 97 %     Weight 11/10/20 1504 62.1 kg (137 lb)     Height 11/10/20 1504 1.651 m (5\' 5" )     Head Circumference --      Peak Flow --      Pain Score 11/10/20 1504 0     Pain Loc --      Pain Edu? --      Excl. in [place]? --     Constitutional: Alert and oriented.  Eyes: Conjunctivae are normal.,  Pupils reactive to light shined in both eyes.  Patient is unable to differentiate even light when left eye is covered.  No peripheral vision impairment of the right eye, normal range of motion Head: Atraumatic. Nose: No congestion/rhinnorhea. Mouth/Throat: Mucous membranes are moist.   Neck:  Painless ROM Cardiovascular: Normal rate, regular rhythm. Grossly normal heart sounds.  Good peripheral circulation. Respiratory: Normal respiratory effort.  No retractions. Lungs CTAB. Gastrointestinal: Soft and nontender. No distention.  No CVA tenderness. Genitourinary: deferred Musculoskeletal: No lower extremity tenderness nor edema.  Warm and well perfused Neurologic:  Normal speech and language.  Right eye visual loss, no evidence of cranial nerve abnormalities Skin:  Skin is warm, dry and intact. No rash noted. Psychiatric: Mood and affect are normal. Speech and behavior are normal.  ____________________________________________   LABS (all labs ordered are listed, but only  abnormal results are displayed)  Labs Reviewed  CBC WITH DIFFERENTIAL/PLATELET - Abnormal; Notable for the following components:      Result Value   RBC 3.52 (*)    Hemoglobin 10.4 (*)    HCT 31.4 (*)    Platelets 443 (*)    All other components within normal limits  BASIC METABOLIC PANEL - Abnormal; Notable for the following components:   Sodium 134 (*)    Glucose, Bld 151 (*)    All other components within normal limits  RESP PANEL BY RT-PCR (FLU A&B, COVID) ARPGX2  SEDIMENTATION RATE  C-REACTIVE PROTEIN  CBC  CREATININE, SERUM   ____________________________________________  EKG  ED ECG REPORT I, Lavonia Drafts, the attending physician, personally viewed and interpreted this ECG.  Date: 11/10/2020  Rhythm: normal sinus rhythm QRS Axis: normal Intervals: normal ST/T Wave abnormalities: Nonspecific changes Narrative Interpretation: no evidence of acute ischemia  ____________________________________________  RADIOLOGY  MRI brain ____________________________________________   PROCEDURES  Procedure(s) performed: No  Procedures   Critical  Care performed: No ____________________________________________   INITIAL IMPRESSION / ASSESSMENT AND PLAN / ED COURSE  Pertinent labs & imaging results that were available during my care of the patient were reviewed by me and considered in my medical decision making (see chart for details).  Patient presents with visual loss status post right carotid stent placement.  She presents today because she is only now realize that she has no vision in her right eye which she discovered after covering up her left eye and realizing that she could not even distinguish light.  She has no pain.  No headache.  She also complained of a tingling numbness sensation around her right orbit which seems to have improved somewhat over the last 2 weeks.  Presentation highly suspicious for retinal artery occlusion.  Discussed with Dr. Doy Mince of  neurology who recommends admission for MRI, CVA  Discussed with Dr. Wallace Going of ophthalmology who notes given time course no acute intervention, can follow-up as outpatient if discharged or inpatient consultation  Discussed with Dr. Trula Slade of vascular surgery  I discussed with the hospitalist who is with the patient    ____________________________________________   FINAL CLINICAL IMPRESSION(S) / ED DIAGNOSES  Final diagnoses:  Central retinal artery occlusion of right eye        Note:  This document was prepared using Dragon voice recognition software and may include unintentional dictation errors.   Lavonia Drafts, MD 11/10/20 [phone]

## 2020-11-10 NOTE — ED Notes (Signed)
MD aware of Elevated BP , ED physician in room

## 2020-11-10 NOTE — ED Notes (Signed)
ED Provider at bedside. 

## 2020-11-10 NOTE — ED Triage Notes (Signed)
Pt had a stent placed in her right neck 2 weeks ago. Pt has had right facial numbness and vision issues for the entire 2 weeks after the surgery. Was advised by surgeon to wait 2 weeks and see if it got better but it has not and now they want a CT/MRI.

## 2020-11-10 NOTE — ED Notes (Signed)
hospitalist at bedside

## 2020-11-10 NOTE — ED Notes (Signed)
Attempted to call report , Floor advised they are looking at the chart at the moment and making a decision if patient will be accepted on the unit

## 2020-11-10 NOTE — ED Notes (Signed)
Attempted to call report

## 2020-11-10 NOTE — ED Notes (Signed)
Pt given a meal tray 

## 2020-11-10 NOTE — ED Notes (Signed)
Blue, green, purple top sent down to lab if needed.

## 2020-11-10 NOTE — ED Notes (Signed)
Pt unable to see from the right eye , also has had facial numbness . Ongoing 2 weeks

## 2020-11-10 NOTE — H&P (Signed)
[place] [facility] [place] NAME: Latoya Herman    MR#:  [id number]  DATE OF BIRTH:  1951-10-30  DATE OF ADMISSION:  11/10/2020  PRIMARY CARE PHYSICIAN: Kirk Ruths, MD   REQUESTING/REFERRING PHYSICIAN: Dr Corky Downs  Patient coming from :[facility]   CHIEF COMPLAINT:  right eye loss of vision and right facial numbness two weeks  HISTORY OF PRESENT ILLNESS:  Latoya Herman  is a 69 y.o. 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 Delana Meyer comes to the emergency room with post procedure right facial numbness and right eye visual disturbance.  Patient said she decided to wait since she was told after procedure symptoms likely will subside. She called vascular surgery on call since her symptoms continue to get worse and was asked to come to the ER.  ED course: patient is hemodynamically stable. ER physician spoke with Dr. Doy Mince neurology and she recommends patient get MRI of the brain to rule out CVA. Dr. Corky Downs spoke with Dr. Murvin Natal ophthalmology and recommended to get CRP.  Patient has been taking her meds at home. She is being admitted for further evaluation of her right eye vision loss. PAST MEDICAL HISTORY:   Past Medical History:  Diagnosis Date  . Chronic kidney disease   . Coronary artery disease   . CVA (cerebral vascular accident) ([place]) 09/21/2020  . Diabetes mellitus without complication ([place])   . Hyperlipidemia   . Hypertension   . SOBOE (shortness of breath on exertion)     PAST SURGICAL HISTOIRY:   Past Surgical History:  Procedure Laterality Date  . CAROTID PTA/STENT INTERVENTION Right 10/23/2020   Procedure: CAROTID PTA/STENT INTERVENTION;  Surgeon: Katha Cabal, MD;  Location: [facility];  Service: Cardiovascular;   Laterality: Right;  . COLONOSCOPY  04/23/2010  . COLONOSCOPY WITH ESOPHAGOGASTRODUODENOSCOPY (EGD)  04/23/2010  . COLONOSCOPY WITH PROPOFOL N/A 08/23/2020   Procedure: COLONOSCOPY WITH PROPOFOL;  Surgeon: Lesly Rubenstein, MD;  Location: [facility];  Service: Endoscopy;  Laterality: N/A;  . CORONARY ANGIOPLASTY WITH STENT PLACEMENT  10/19/2013  . SUPERFICIAL LYMPH NODE BIOPSY / EXCISION  2013    SOCIAL HISTORY:   Social History   Tobacco Use  . Smoking status: Never Smoker  . Smokeless tobacco: Never Used  Substance Use Topics  . Alcohol use: Never    FAMILY HISTORY:   Family History  Problem Relation Age of Onset  . Breast cancer Neg Hx     DRUG ALLERGIES:   Allergies  Allergen Reactions  . Penicillins Swelling and Other (See Comments)  . Lisinopril Rash    Possible rash     REVIEW OF SYSTEMS:  Review of Systems  Constitutional: Negative for chills, fever and weight loss.  HENT: Negative for ear discharge, ear pain and nosebleeds.   Eyes: Positive for blurred vision. Negative for pain and discharge.       Right eye loss of vision  Respiratory: Negative for sputum production, shortness of breath, wheezing and stridor.   Cardiovascular: Negative for chest pain, palpitations, orthopnea and PND.  Gastrointestinal: Negative for abdominal pain, diarrhea, nausea and vomiting.  Genitourinary: Negative for frequency and urgency.  Musculoskeletal: Negative for back pain and joint pain.  Neurological: Negative for sensory change, speech change, focal weakness and weakness.  Psychiatric/Behavioral: Negative for depression and hallucinations.  The patient is not nervous/anxious.      MEDICATIONS AT HOME:   Prior to Admission medications   Medication Sig Start Date End Date Taking? Authorizing Provider  aspirin 81 MG chewable tablet Chew 1 tablet (81 mg total) by mouth daily. 09/24/20   Lorella Nimrod, MD  atorvastatin (LIPITOR) 80 MG tablet Take 80 mg by mouth daily.     [provider]  carvedilol (COREG) 25 MG tablet Take 25 mg by mouth 2 (two) times daily with a meal.    [provider]  clopidogrel (PLAVIX) 75 MG tablet Take 75 mg by mouth daily at 12 noon.     [provider]  gabapentin (NEURONTIN) 100 MG capsule Take 100 mg by mouth at bedtime.     [provider]  levothyroxine (SYNTHROID) 75 MCG tablet Take 75 mcg by mouth daily before breakfast.    [provider]  spironolactone (ALDACTONE) 25 MG tablet Take 25 mg by mouth daily.    [provider]      VITAL SIGNS:  Blood pressure (!) 182/81, pulse 78, temperature 97.7 F (36.5 C), temperature source Oral, resp. rate (!) 26, height 5\' 5"  (1.651 m), weight 62.1 kg, SpO2 100 %.  PHYSICAL EXAMINATION:  GENERAL:  69 y.o.-year-old patient lying in the bed with no acute distress.  EYES: Pupils equal, round, reactive to light and accommodation. No scleral icterus. Right eye decreased vision HEENT: Head atraumatic, normocephalic. Oropharynx and nasopharynx clear.  NECK:  Supple, no jugular venous distention. No thyroid enlargement, no tenderness.  LUNGS: Normal breath sounds bilaterally, no wheezing, rales,rhonchi or crepitation. No use of accessory muscles of respiration.  CARDIOVASCULAR: S1, S2 normal. No murmurs, rubs, or gallops.  ABDOMEN: Soft, nontender, nondistended. Bowel sounds present. No organomegaly or mass.  EXTREMITIES: No pedal edema, cyanosis, or clubbing.  NEUROLOGIC:  Muscle strength 5/5 in all extremities. Right facial decrease sensation. Loss of field vision right eye Gait not checked.  PSYCHIATRIC: The patient is alert and oriented x 3.  SKIN: No obvious rash, lesion, or ulcer.   LABORATORY PANEL:   CBC Recent Labs  Lab 11/10/20 1516  WBC 8.0  HGB 10.4*  HCT 31.4*  PLT 443*   ------------------------------------------------------------------------------------------------------------------  Chemistries  Recent Labs   Lab 11/10/20 1516  NA 134*  K 4.5  CL 102  CO2 23  GLUCOSE 151*  BUN 21  CREATININE 0.98  CALCIUM 9.3   ------------------------------------------------------------------------------------------------------------------  Cardiac Enzymes No results for input(s): TROPONINI in the last 168 hours. ------------------------------------------------------------------------------------------------------------------  RADIOLOGY:  No results found.  EKG:    IMPRESSION AND PLAN:   Latoya Herman  is a 68 y.o. 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 Delana Meyer comes to the emergency room with post procedure right facial numbness and right eye visual disturbance.  Right eye visual disturbance with right facial numbness rule out CVA post procedure -admit to MedSurg -patient to get MRI of the brain -neurology consultation with Dr. Doy Mince. -Will continue Plavix, statins -ophthalmology was consulted by Dr. Corky Downs no further recommendations at present. Follow-up MRI brain  Right carotid artery stenosis status post recent stent placement November 10 -- will get vascular surgery consult once MRI is reviewed.  Hyperlipidemia continue statin  hypothyroidism  continue Synthroid  Hypertension  continue Coreg, spironolactone PRN hydralazine  Diabetes type II, controlled with  vascular complication -sliding scale insulin  Family Communication : no family in the ER Consults : neurology Code Status : full DVT prophylaxis : Lovenox Status is: Observation  The patient remains OBS appropriate and will d/c before 2 midnights.  Dispo: The patient is from: Home              Anticipated d/c is to: Home              Anticipated d/c date is: 1 day              Patient currently is not medically stable to  d/c.       TOTAL TIME TAKING CARE OF THIS PATIENT: *45* minutes.    Fritzi Mandes M.D  [facility]     CC: Primary care physician; Kirk Ruths, MD

## 2020-11-10 NOTE — ED Notes (Signed)
MRI advised they where coming to get patient

## 2020-11-11 ENCOUNTER — Encounter: Payer: Self-pay | Admitting: Internal Medicine

## 2020-11-11 ENCOUNTER — Observation Stay: Payer: Medicare HMO

## 2020-11-11 DIAGNOSIS — H3411 Central retinal artery occlusion, right eye: Secondary | ICD-10-CM | POA: Diagnosis not present

## 2020-11-11 DIAGNOSIS — E785 Hyperlipidemia, unspecified: Secondary | ICD-10-CM | POA: Diagnosis not present

## 2020-11-11 DIAGNOSIS — I639 Cerebral infarction, unspecified: Secondary | ICD-10-CM | POA: Diagnosis not present

## 2020-11-11 DIAGNOSIS — H539 Unspecified visual disturbance: Secondary | ICD-10-CM | POA: Diagnosis not present

## 2020-11-11 DIAGNOSIS — I10 Essential (primary) hypertension: Secondary | ICD-10-CM | POA: Diagnosis not present

## 2020-11-11 LAB — GLUCOSE, CAPILLARY
Glucose-Capillary: 118 mg/dL — ABNORMAL HIGH (ref 70–99)
Glucose-Capillary: 135 mg/dL — ABNORMAL HIGH (ref 70–99)

## 2020-11-11 MED ORDER — METOPROLOL SUCCINATE ER 50 MG PO TB24
50.0000 mg | ORAL_TABLET | Freq: Every day | ORAL | Status: DC
  Administered 2020-11-11: 50 mg via ORAL
  Filled 2020-11-11: qty 1

## 2020-11-11 MED ORDER — IOHEXOL 350 MG/ML SOLN
75.0000 mL | Freq: Once | INTRAVENOUS | Status: AC | PRN
  Administered 2020-11-11: 75 mL via INTRAVENOUS

## 2020-11-11 MED ORDER — METOPROLOL SUCCINATE ER 50 MG PO TB24
50.0000 mg | ORAL_TABLET | Freq: Every day | ORAL | 1 refills | Status: DC
Start: 2020-11-12 — End: 2024-09-25

## 2020-11-11 MED ORDER — ENSURE MAX PROTEIN PO LIQD
11.0000 [oz_av] | Freq: Two times a day (BID) | ORAL | 1 refills | Status: DC
Start: 2020-11-11 — End: 2024-09-25

## 2020-11-11 MED ORDER — ENSURE MAX PROTEIN PO LIQD
11.0000 [oz_av] | Freq: Two times a day (BID) | ORAL | Status: DC
  Administered 2020-11-11: 11 [oz_av] via ORAL
  Filled 2020-11-11: qty 330

## 2020-11-11 NOTE — Care Management Obs Status (Signed)
[place] [facility] NOTIFICATION   Patient Details  Name: Latoya Herman MRN: [id number] Date of Birth: December 29, 1950   Medicare Observation Status Notification Given:  No (Admitted obs less than 24 hours)    Beverly Sessions, RN 11/11/2020, 2:18 PM

## 2020-11-11 NOTE — Discharge Instructions (Signed)
Patient is advised NOT to drive. She will have to go to [facility] to be reassessed for driving with disability  Pt advised to f/u with her Opthalmologist

## 2020-11-11 NOTE — Consult Note (Signed)
Referring Physician: Dr. Posey Pronto    Chief Complaint: Right monocular vision loss  HPI: Latoya Herman is an 69 y.o. female with a PMHx of CKD, CAD, CVA, DM, HLD and HTN who underwent right carotid stenting on 11/10 for severe > 95% right ICA stenosis (CTA had also shown 30% left ICA stenosis, wide patency of the left vertebral artery and occlusion of the distal right vertebral artery). On Saturday (11/27) she called her Vascular Surgeon with new complaints as documented in the telephone encounter note from that day: "Patient is recently status post right carotid stent for a prior stroke.  She states that in the post operative period, she complained of right sided facial numbness, bilateral eye lid drooping,  and a discomfort behind her eye.  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  She is not sure when this began in the post operative period, it could have been immediately after surgery, as she has not been able to read since she got home.  I proposed 2 options, either coming to the ER for additional testing to evaluate her stent or coming to the office on Monday for a duplex.  Due to her concerns, I have recommended that she come to the ER for further evaluation."  She presented to the ER on Sunday with the above complaints. BP was elevated. She clarified to the EDP that she had been having difficulties seeing with her right eye since waking up from anesthesia after the right ICA stenting 2 weeks previously. She thought it would get better after the anesthesia wore off; however, she became concerned when she realized on Saturday that she was unable to see out of her right eye at all. She discovered this after covering up her left eye and realizing that she could not even distinguish light with her right eye.  She denied any eye pain and also had no headache.  She did complain of a tingling/numbness sensation around her right orbit which seemed to have  improved somewhat over the last 2 weeks.  EDP discussed the patient with Dr. Wallace Going of Ophthalmology who noted that given the time course there was no acute intervention indicated, with oupatient follow up versus inpatient Ophthalmology consultation recommended.   PTA meds included Plavix, ASA and statin, which have been continued. Vascular Surgery consult has been ordered per Hospitalist note.   MRI brain was obtained, revealing a small subacute white matter ischemic infarction in the splenium of the corpus callosum on the right.   MRI brain: 1. New 5 mm subacute white matter infarct in the right forceps major. 2. Interval evolution of small right cerebellar infarcts with mild residual signal abnormality. 3. Several new scattered chronic microhemorrhages in the right cerebral hemisphere. 4. Chronic left paramedian pontine infarct.  LSN: 2 weeks prior to presentation   Past Medical History:  Diagnosis Date  . Chronic kidney disease   . Coronary artery disease   . CVA (cerebral vascular accident) ([place]) 09/21/2020  . Diabetes mellitus without complication ([place])   . Hyperlipidemia   . Hypertension   . SOBOE (shortness of breath on exertion)     Past Surgical History:  Procedure Laterality Date  . CAROTID PTA/STENT INTERVENTION Right 10/23/2020   Procedure: CAROTID PTA/STENT INTERVENTION;  Surgeon: Katha Cabal, MD;  Location: [facility];  Service: Cardiovascular;  Laterality: Right;  . COLONOSCOPY  04/23/2010  . COLONOSCOPY WITH ESOPHAGOGASTRODUODENOSCOPY (EGD)  04/23/2010  .  COLONOSCOPY WITH PROPOFOL N/A 08/23/2020   Procedure: COLONOSCOPY WITH PROPOFOL;  Surgeon: Lesly Rubenstein, MD;  Location: [facility];  Service: Endoscopy;  Laterality: N/A;  . CORONARY ANGIOPLASTY WITH STENT PLACEMENT  10/19/2013  . SUPERFICIAL LYMPH NODE BIOPSY / EXCISION  2013    Family History  Problem Relation Age of Onset  . Breast cancer Neg Hx    Social History:   reports that she has never smoked. She has never used smokeless tobacco. She reports that she does not drink alcohol and does not use drugs.  Allergies:  Allergies  Allergen Reactions  . Penicillins Swelling and Other (See Comments)  . Lisinopril Rash    Possible rash     Medications:  Prior to Admission:  Medications Prior to Admission  Medication Sig Dispense Refill Last Dose  . aspirin 81 MG chewable tablet Chew 1 tablet (81 mg total) by mouth daily. 30 tablet 0   . atorvastatin (LIPITOR) 80 MG tablet Take 80 mg by mouth daily.     . carvedilol (COREG) 25 MG tablet Take 25 mg by mouth 2 (two) times daily with a meal.     . clopidogrel (PLAVIX) 75 MG tablet Take 75 mg by mouth daily at 12 noon.      . gabapentin (NEURONTIN) 100 MG capsule Take 100 mg by mouth at bedtime.      Marland Kitchen levothyroxine (SYNTHROID) 75 MCG tablet Take 75 mcg by mouth daily before breakfast.     . spironolactone (ALDACTONE) 25 MG tablet Take 25 mg by mouth daily.      Scheduled: . atorvastatin  80 mg Oral Daily  . carvedilol  25 mg Oral BID WC  . clopidogrel  75 mg Oral Q1200  . enoxaparin (LOVENOX) injection  40 mg Subcutaneous Q24H  . gabapentin  100 mg Oral QHS  . insulin aspart  0-5 Units Subcutaneous QHS  . insulin aspart  0-9 Units Subcutaneous TID WC  . levothyroxine  75 mcg Oral QAC breakfast  . pneumococcal 23 valent vaccine  0.5 mL Intramuscular Tomorrow-1000  . spironolactone  25 mg Oral Daily     ROS: As per HPI. Does not endorse any additional complaints.   Physical Examination: Blood pressure (!) 113/57, pulse 79, temperature 98.3 F (36.8 C), temperature source Oral, resp. rate 18, height 5' 5"  (1.651 m), weight 62.1 kg, SpO2 97 %.  HEENT: [place]/AT. No periorbital swelling or discoloration.  Lungs: Respirations unlabored Ext: No edema  Neurologic Examination: Mental Status: Alert, oriented, thought content appropriate.  Speech fluent without evidence of aphasia.  Able to follow all  commands without difficulty. Cranial Nerves: II:  Visual fields intact OS. Right eye with complete vision loss including no light/dark perception. Right pupil 5 mm in ambient light as well as with penlight. Left pupil reactive 3 mm >> 2 mm with consensual response intact on the right.  III,IV, VI: No ptosis. EOMI.  V,VII: Smile symmetric, facial temp sensation equal bilaterally VIII: hearing intact to voice IX,X: No hypophonia XI: Symmetric XII: Midline tongue extension  Motor: Right : Upper extremity   5/5    Left:     Upper extremity   5/5  Lower extremity   5/5     Lower extremity   5/5 No pronator drift Sensory: Temp and light touch intact throughout, bilaterally. No extinction to DSS Deep Tendon Reflexes:  2+ bilateral upper and lower extremities.  Cerebellar: No ataxia with FNF bilaterally  Gait: Deferred  Results for orders placed  or performed during the hospital encounter of 11/10/20 (from the past 48 hour(s))  CBC with Differential     Status: Abnormal   Collection Time: 11/10/20  3:16 PM  Result Value Ref Range   WBC 8.0 4.0 - 10.5 K/uL   RBC 3.52 (L) 3.87 - 5.11 MIL/uL   Hemoglobin 10.4 (L) 12.0 - 15.0 g/dL   HCT 31.4 (L) 36 - 46 %   MCV 89.2 80.0 - 100.0 fL   MCH 29.5 26.0 - 34.0 pg   MCHC 33.1 30.0 - 36.0 g/dL   RDW 13.2 11.5 - 15.5 %   Platelets 443 (H) 150 - 400 K/uL   nRBC 0.0 0.0 - 0.2 %   Neutrophils Relative % 73 %   Neutro Abs 5.8 1.7 - 7.7 K/uL   Lymphocytes Relative 18 %   Lymphs Abs 1.5 0.7 - 4.0 K/uL   Monocytes Relative 5 %   Monocytes Absolute 0.4 0.1 - 1.0 K/uL   Eosinophils Relative 3 %   Eosinophils Absolute 0.2 0.0 - 0.5 K/uL   Basophils Relative 1 %   Basophils Absolute 0.1 0.0 - 0.1 K/uL   Immature Granulocytes 0 %   Abs Immature Granulocytes 0.03 0.00 - 0.07 K/uL    Comment: Performed at [facility], [address] [place], [place] [address]  Basic metabolic panel     Status: Abnormal   Collection Time: 11/10/20  3:16 PM   Result Value Ref Range   Sodium 134 (L) 135 - 145 mmol/L   Potassium 4.5 3.5 - 5.1 mmol/L   Chloride 102 98 - 111 mmol/L   CO2 23 22 - 32 mmol/L   Glucose, Bld 151 (H) 70 - 99 mg/dL    Comment: Glucose reference range applies only to samples taken after fasting for at least 8 hours.   BUN 21 8 - 23 mg/dL   Creatinine, Ser 0.98 0.44 - 1.00 mg/dL   Calcium 9.3 8.9 - 10.3 mg/dL   GFR, Estimated >60 >60 mL/min    Comment: (NOTE) Calculated using the CKD-EPI Creatinine Equation (2021)    Anion gap 9 5 - 15    Comment: Performed at [facility], [address] [place], [place] [address]  Sedimentation rate     Status: Abnormal   Collection Time: 11/10/20  7:15 PM  Result Value Ref Range   Sed Rate 58 (H) 0 - 30 mm/hr    Comment: Performed at [facility], [address] [place], [place] [address]  C-reactive protein     Status: None   Collection Time: 11/10/20  7:15 PM  Result Value Ref Range   CRP 0.7 <1.0 mg/dL    Comment: Performed at [facility] [address] [place],  [address]  Resp Panel by RT-PCR (Flu A&B, Covid) Nasopharyngeal Swab     Status: None   Collection Time: 11/10/20  7:15 PM   Specimen: Nasopharyngeal Swab; Nasopharyngeal(NP) swabs in vial transport medium  Result Value Ref Range   SARS Coronavirus 2 by RT PCR NEGATIVE NEGATIVE    Comment: (NOTE) SARS-CoV-2 target nucleic acids are NOT DETECTED.  The SARS-CoV-2 RNA is generally detectable in upper respiratory specimens during the acute phase of infection. The lowest concentration of SARS-CoV-2 viral copies this assay can detect is 138 copies/mL. A negative result does not preclude SARS-Cov-2 infection and should not be used as the sole basis for treatment or other patient management decisions. A negative result may occur with  improper specimen collection/handling, submission of specimen other than nasopharyngeal swab, presence of viral mutation(s) within the areas  targeted by this assay, and inadequate number of viral copies(<138 copies/mL). A negative result must be combined with clinical observations, patient history, and epidemiological information. The expected result is Negative.  Fact Sheet for Patients:  EntrepreneurPulse.com.au  Fact Sheet for Healthcare Providers:  IncredibleEmployment.be  This test is no t yet approved or cleared by the [place] FDA and  has been authorized for detection and/or diagnosis of SARS-CoV-2 by FDA under an Emergency Use Authorization (EUA). This EUA will remain  in effect (meaning this test can be used) for the duration of the COVID-19 declaration under Section 564(b)(1) of the Act, 21 U.S.C.section 360bbb-3(b)(1), unless the authorization is terminated  or revoked sooner.       Influenza A by PCR NEGATIVE NEGATIVE   Influenza B by PCR NEGATIVE NEGATIVE    Comment: (NOTE) The Xpert Xpress SARS-CoV-2/FLU/RSV plus assay is intended as an aid in the diagnosis of influenza from Nasopharyngeal swab specimens and should not be used as a sole basis for treatment. Nasal washings and aspirates are unacceptable for Xpert Xpress SARS-CoV-2/FLU/RSV testing.  Fact Sheet for Patients: EntrepreneurPulse.com.au  Fact Sheet for Healthcare Providers: IncredibleEmployment.be  This test is not yet approved or cleared by the [place] FDA and has been authorized for detection and/or diagnosis of SARS-CoV-2 by FDA under an Emergency Use Authorization (EUA). This EUA will remain in effect (meaning this test can be used) for the duration of the COVID-19 declaration under Section 564(b)(1) of the Act, 21 U.S.C. section 360bbb-3(b)(1), unless the authorization is terminated or revoked.  Performed at [facility], [address] [place], [place] [address]   CBC     Status: Abnormal   Collection Time: 11/10/20  7:15 PM  Result  Value Ref Range   WBC 8.5 4.0 - 10.5 K/uL   RBC 3.49 (L) 3.87 - 5.11 MIL/uL   Hemoglobin 10.3 (L) 12.0 - 15.0 g/dL   HCT 31.5 (L) 36 - 46 %   MCV 90.3 80.0 - 100.0 fL   MCH 29.5 26.0 - 34.0 pg   MCHC 32.7 30.0 - 36.0 g/dL   RDW 13.2 11.5 - 15.5 %   Platelets 402 (H) 150 - 400 K/uL   nRBC 0.0 0.0 - 0.2 %    Comment: Performed at [facility], [address] [place], [place] [address]  Creatinine, serum     Status: None   Collection Time: 11/10/20  7:15 PM  Result Value Ref Range   Creatinine, Ser 0.94 0.44 - 1.00 mg/dL   GFR, Estimated >60 >60 mL/min    Comment: (NOTE) Calculated using the CKD-EPI Creatinine Equation (2021) Performed at [facility], [address] [place], [place] [address]   CBG monitoring, ED     Status: Abnormal   Collection Time: 11/10/20  7:31 PM  Result Value Ref Range   Glucose-Capillary 109 (H) 70 - 99 mg/dL    Comment: Glucose reference range applies only to samples taken after fasting for at least 8 hours.  Glucose, capillary     Status: Abnormal   Collection Time: 11/10/20 10:53 PM  Result Value Ref Range   Glucose-Capillary 120 (H) 70 - 99 mg/dL    Comment: Glucose reference range applies only to samples taken after fasting for at least 8 hours.  Glucose, capillary     Status: Abnormal   Collection Time: 11/11/20  7:27 AM  Result Value Ref Range   Glucose-Capillary 118 (H) 70 - 99 mg/dL    Comment: Glucose reference range applies only to samples taken after fasting for at least 8 hours.   MR BRAIN WO CONTRAST  Result Date: 11/10/2020 CLINICAL DATA:  Right eye vision loss and facial numbness following right carotid stent placement on 10/23/2020. EXAM: MRI HEAD WITHOUT CONTRAST TECHNIQUE: Multiplanar, multiecho pulse sequences of the brain and surrounding structures were obtained without intravenous contrast. COMPARISON:  Head MRI 09/21/2020 and CTA 09/22/2020 FINDINGS: Brain: Restricted diffusion associated with small acute  right cerebellar infarcts on the prior MRI has resolved with only subtle residual T2 hyperintensity. There is a new 5 mm focus of trace diffusion weighted signal hyperintensity in the white matter at the level of the right forceps major with subtly reduced ADC and subtle T2 hyperintensity suggestive of a subacute infarct. A chronic left paramedian pontine infarct is unchanged. There are several new scattered chronic microhemorrhages in the right cerebral hemisphere including in the anterior occipital lobe, superior temporal lobe, and medial frontal lobe without associated restricted diffusion or T2 signal abnormality. Patchy T2 hyperintensity in the pons and scattered punctate foci of T2 hyperintensity in the cerebral white matter bilaterally are unchanged and compatible with mild chronic small vessel ischemic disease. Mild cerebral atrophy is within normal limits for age. Vascular: Abnormal appearance of the distal right vertebral artery corresponding to the occlusion demonstrated on CTA. Skull and upper cervical spine: Unremarkable bone marrow signal. Sinuses/Orbits: Unremarkable orbits. Mild scattered mucosal thickening in the paranasal sinuses. Clear mastoid air cells. Other: None. IMPRESSION: 1. New 5 mm subacute white matter infarct in the right forceps major. 2. Interval evolution of small right cerebellar infarcts with mild residual signal abnormality. 3. Several new scattered chronic microhemorrhages in the right cerebral hemisphere. 4. Chronic pontine infarct. Electronically Signed   By: Logan Bores M.D.   On: 11/10/2020 18:46    Assessment: 69 y.o. female with right monocular vision loss following right ICA stenting 1. Exam reveals complete vision loss OD 2. MRI reveals a new 5 mm subacute white matter infarct in the right forceps major. There is Interval evolution of small right cerebellar infarcts with mild residual signal abnormality. Several new scattered chronic microhemorrhages in the right  cerebral hemisphere. Chronic pontine infarct.  3. Although her ESR is elevated, she does not endorse having headaches, jaw claudication or temporal pain. The etiology for her monocular vision loss is most likely a CRAO from perioprocedural distal embolization of cholesterol plaque or thrombus from the site of the right ICA stent placement. The subacute right forceps major infarct seen on MRI most likely occurred at that time as well.  4. Recent TTE results are in Epic and have been reviewed 5. CTA of head and neck: Severe stenosis of the distal right P2 PCA at a branch point and moderate stenosis of the proximal left P2 PCA. Similar severe stenosis of the right vertebral artery at its dural margin with occlusion of the V4 segment beyond PICA. Patent right ICA stent without evidence of in-stent stenosis. 6. Stroke Risk Factors - Recent vascular procedure, DM, HTN. HLD, prior stroke and CAD  Recommendations: 1. Continue ASA and Plavix 2. Continue atorvastatin 3. Outpatient Ophthalmology and Neurology follow ups 4. No driving for now. Will need to be assessed by [facility] which offers special driving tests for individuals with disabilities/impairments.  5. BP management.  6. Stroke risk factor reduction.    @Electronically  signed: Dr. Kerney Elbe 11/11/2020,  8:16 AM

## 2020-11-11 NOTE — Discharge Summary (Signed)
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 Chronic kidney disease   . Coronary artery disease   . CVA (cerebral vascular accident) ([place]) 09/21/2020  . Diabetes mellitus without complication ([place])   . Hyperlipidemia   . Hypertension   . SOBOE (shortness of breath on exertion)     HOSPITAL COURSE:   Mahogony Gilchrest  is a 69 y.o. 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 Delana Meyer comes to the emergency room with post procedure right facial numbness and right eye visual disturbance.  Right eye visual disturbance with right facial numbness rule out CVA post procedure  - MRI brain:11/10/20 1. New 5 mm subacute white matter infarct in the right forceps major. 2. Interval evolution of small right cerebellar infarcts with mild residual signal abnormality. 3. Several new scattered chronic microhemorrhages in the right cerebral hemisphere. 4. Chronic left paramedian pontine infarct.  --Neurology consultation with Dr. Doy Mince on 11/28 --Will continue Plavix, statins and asa per Dr Yvetta Coder recs on 11/29 f/u visit -ophthalmology was consulted by Dr. Corky Downs no further recommendations at present.--pt will follow her Eye doc at [facility]. -- Patient is also advised not to drive. She  will need to be assessed at [facility] for driving in future which offer special driving test for individuals with disabilities/impairments.. Daughter in the room voice understanding -- patient will follow-up with neurology as outpatient  Right carotid artery stenosis status post recent stent placement November 10th,2021 -- follow-up vascular surgery as outpatientHyperlipidemia continue statin  Hypothyroidism  continue Synthroid  Hypertension  --was on Coreg, spironolactone --PRN hydralazine --patient reports Coreg is causing her to have dizziness. She wants me to start her back on Toprol-XL 50 mg daily.  Diabetes type II, controlled with vascular complication -sliding scale insulin  Family Communication : daughter Lynelle Smoke in the room   Consults : neurology Code Status : full DVT prophylaxis : Lovenox Status is: Observation  The patient remains OBS appropriate and will d/c before 2 midnights.  Dispo: The patient is from: Home  Anticipated d/c is to: Home  Anticipated d/c date is: today  Patient currently is  medically stable to d/c. Patient was seen by PT and OT. Hemodynamically otherwise stable. discharge to home with outpatient follow-up ophthalmology and neurology. Patient also will follow-up with primary care  CONSULTS OBTAINED:  Treatment Team:  Schnier, Dolores Lory, MD Kerney Elbe, MD  DRUG ALLERGIES:   Allergies  Allergen Reactions  . Penicillins Swelling and Other (See Comments)  . Lisinopril Rash    Possible rash     DISCHARGE MEDICATIONS:   Allergies as of 11/11/2020      Reactions   Penicillins Swelling, Other (See Comments)   Lisinopril Rash   Possible rash      Medication List    STOP taking these medications  carvedilol 25 MG tablet Commonly known as: COREG     TAKE these medications   aspirin 81 MG chewable tablet Chew 1 tablet (81 mg total) by mouth daily.   atorvastatin 80 MG tablet Commonly  known as: LIPITOR Take 80 mg by mouth daily.   clopidogrel 75 MG tablet Commonly known as: PLAVIX Take 75 mg by mouth daily at 12 noon.   Ensure Max Protein Liqd Take 330 mLs (11 oz total) by mouth 2 (two) times daily.   gabapentin 100 MG capsule Commonly known as: NEURONTIN Take 100 mg by mouth at bedtime.   levothyroxine 75 MCG tablet Commonly known as: SYNTHROID Take 75 mcg by mouth daily before breakfast.   metoprolol succinate 50 MG 24 hr tablet Commonly known as: TOPROL-XL Take 1 tablet (50 mg total) by mouth daily. Take with or immediately following a meal. Start taking on: November 12, 2020   spironolactone 25 MG tablet Commonly known as: ALDACTONE Take 25 mg by mouth daily.       If you experience worsening of your admission symptoms, develop shortness of breath, life threatening emergency, suicidal or homicidal thoughts you must seek medical attention immediately by calling 911 or calling your MD immediately  if symptoms less severe.  You Must read complete instructions/literature along with all the possible adverse reactions/side effects for all the Medicines you take and that have been prescribed to you. Take any new Medicines after you have completely understood and accept all the possible adverse reactions/side effects.   Please note  You were cared for by a hospitalist during your hospital stay. If you have any questions about your discharge medications or the care you received while you were in the hospital after you are discharged, you can call the unit and asked to speak with the hospitalist on call if the hospitalist that took care of you is not available. Once you are discharged, your primary care physician will handle any further medical issues. Please note that NO REFILLS for any discharge medications will be authorized once you are discharged, as it is imperative that you return to your primary care physician (or establish a relationship with a primary care  physician if you do not have one) for your aftercare needs so that they can reassess your need for medications and monitor your lab values. Today   SUBJECTIVE   Right eye vision loss  VITAL SIGNS:  Blood pressure (!) 141/69, pulse 78, temperature 98.7 F (37.1 C), temperature source Oral, resp. rate 18, height 5\' 5"  (1.651 m), weight 62.1 kg, SpO2 96 %.  I/O:    Intake/Output Summary (Last 24 hours) at 11/11/2020 1433 Last data filed at 11/11/2020 1300 Gross per 24 hour  Intake 480 ml  Output --  Net 480 ml    PHYSICAL EXAMINATION:  GENERAL:  69 y.o.-year-old patient lying in the bed with no acute distress.  EYES: Pupils equal, round, reactive to light and accommodation. No scleral icterus. Right eye vision loss+ HEENT: Head atraumatic, normocephalic. Oropharynx and nasopharynx clear.  LUNGS: Normal breath sounds bilaterally, no wheezing, rales,rhonchi or crepitation. No use of accessory muscles of respiration.  CARDIOVASCULAR: S1, S2 normal. No murmurs, rubs, or gallops.  ABDOMEN: Soft, non-tender, non-distended. Bowel sounds present. No organomegaly or mass.  EXTREMITIES: No pedal edema, cyanosis, or clubbing.  NEUROLOGIC: Cranial nerves II through XII are intact. Muscle strength 5/5 in all extremities. Sensation intact. Gait not checked.  PSYCHIATRIC:  patient is alert and oriented x 3.  SKIN:  No obvious rash, lesion, or ulcer.   DATA REVIEW:   CBC  Recent Labs  Lab 11/10/20 1915  WBC 8.5  HGB 10.3*  HCT 31.5*  PLT 402*    Chemistries  Recent Labs  Lab 11/10/20 1516 11/10/20 1516 11/10/20 1915  NA 134*  --   --   K 4.5  --   --   CL 102  --   --   CO2 23  --   --   GLUCOSE 151*  --   --   BUN 21  --   --   CREATININE 0.98   < > 0.94  CALCIUM 9.3  --   --    < > = values in this interval not displayed.    Microbiology Results   Recent Results (from the past 240 hour(s))  Resp Panel by RT-PCR (Flu A&B, Covid) Nasopharyngeal Swab     Status: None    Collection Time: 11/10/20  7:15 PM   Specimen: Nasopharyngeal Swab; Nasopharyngeal(NP) swabs in vial transport medium  Result Value Ref Range Status   SARS Coronavirus 2 by RT PCR NEGATIVE NEGATIVE Final    Comment: (NOTE) SARS-CoV-2 target nucleic acids are NOT DETECTED.  The SARS-CoV-2 RNA is generally detectable in upper respiratory specimens during the acute phase of infection. The lowest concentration of SARS-CoV-2 viral copies this assay can detect is 138 copies/mL. A negative result does not preclude SARS-Cov-2 infection and should not be used as the sole basis for treatment or other patient management decisions. A negative result may occur with  improper specimen collection/handling, submission of specimen other than nasopharyngeal swab, presence of viral mutation(s) within the areas targeted by this assay, and inadequate number of viral copies(<138 copies/mL). A negative result must be combined with clinical observations, patient history, and epidemiological information. The expected result is Negative.  Fact Sheet for Patients:  EntrepreneurPulse.com.au  Fact Sheet for Healthcare Providers:  IncredibleEmployment.be  This test is no t yet approved or cleared by the [place] FDA and  has been authorized for detection and/or diagnosis of SARS-CoV-2 by FDA under an Emergency Use Authorization (EUA). This EUA will remain  in effect (meaning this test can be used) for the duration of the COVID-19 declaration under Section 564(b)(1) of the Act, 21 U.S.C.section 360bbb-3(b)(1), unless the authorization is terminated  or revoked sooner.       Influenza A by PCR NEGATIVE NEGATIVE Final   Influenza B by PCR NEGATIVE NEGATIVE Final    Comment: (NOTE) The Xpert Xpress SARS-CoV-2/FLU/RSV plus assay is intended as an aid in the diagnosis of influenza from Nasopharyngeal swab specimens and should not be used as a sole basis for treatment.  Nasal washings and aspirates are unacceptable for Xpert Xpress SARS-CoV-2/FLU/RSV testing.  Fact Sheet for Patients: EntrepreneurPulse.com.au  Fact Sheet for Healthcare Providers: IncredibleEmployment.be  This test is not yet approved or cleared by the [place] FDA and has been authorized for detection and/or diagnosis of SARS-CoV-2 by FDA under an Emergency Use Authorization (EUA). This EUA will remain in effect (meaning this test can be used) for the duration of the COVID-19 declaration under Section 564(b)(1) of the Act, 21 U.S.C. section 360bbb-3(b)(1), unless the authorization is terminated or revoked.  Performed at [facility], [address] [place], [place] [address]     RADIOLOGY:  CT ANGIO HEAD W OR WO CONTRAST  Result Date: 11/11/2020 CLINICAL DATA:  Stroke/TIA. Right eye loss of vision and right facial numbness for 2  weeks. EXAM: CT ANGIOGRAPHY HEAD AND NECK TECHNIQUE: Multidetector CT imaging of the head and neck was performed using the standard protocol during bolus administration of intravenous contrast. Multiplanar CT image reconstructions and MIPs were obtained to evaluate the vascular anatomy. Carotid stenosis measurements (when applicable) are obtained utilizing NASCET criteria, using the distal internal carotid diameter as the denominator. CONTRAST:  83mL OMNIPAQUE IOHEXOL 350 MG/ML SOLN COMPARISON:  CTA 09/22/2020. FINDINGS: CT HEAD FINDINGS Brain: Please see recent MRI for characterization of infarct in the right forceps major and evolving right cerebellar infarcts. No evidence of acute hemorrhage, hydrocephalus, extra-axial collection or mass lesion/mass effect. Remote lacunar infarct involving the left pons. Vascular: Calcific atherosclerosis. Skull: No acute fracture. Sinuses: No acute finding. Orbits: No acute finding. Review of the MIP images confirms the above findings CTA NECK FINDINGS Aortic arch:  Atherosclerosis of the arch and left subclavian. Great vessel origins are patent. Right carotid system: Interval placement of a stent within the proximal internal carotid artery, which appears patent. No specific evidence of in stent stenosis. Left carotid system: Mild atherosclerosis at the carotid bifurcation and at the skull base without greater than 50% narrowing. Vertebral arteries: Left dominant. Similar moderate stenosis of the right vertebral artery at the atlantooccipital level (series 517, image 383). Skeleton: C6-C7 ACDF. Similar adjacent level degenerative disc disease at C5-C6 and to a lesser extent C4-C5. Similar mild anterolisthesis of C4 on C5. Other neck: No mass or suspicious adenopathy. Upper chest: No acute findings. Review of the MIP images confirms the above findings CTA HEAD FINDINGS Anterior circulation: Atherosclerotic calcification involving bilateral cavernous centric right arteries. Overall, less than 50% narrowing, similar to prior. Bilateral MCA and ACA arteries are patent without evidence of hemodynamically significant proximal stenosis or large vessel occlusion. Posterior circulation: Similar severe stenosis of the right vertebral artery where it becomes intradural (series 517, image 39). The intradural right vertebral artery is poorly opacified and occluded distal to the PICA. Left intradural vertebral artery and basilar artery are patent. There is severe stenosis of the right distal P2 PCA at a branch point (see series 517, images 500-505). There is moderate stenosis of the proximal left P2 PCA Venous sinuses: As permitted by contrast timing, patent. Review of the MIP images confirms the above findings IMPRESSION: 1. Please see recent MRI for characterization of infarct in the right forceps major and evolving right cerebellar infarcts. Otherwise, no evidence of acute intracranial abnormality. 2. Severe stenosis of the distal right P2 PCA at a branch point and moderate stenosis of  the proximal left P2 PCA. 3. Similar severe stenosis of the right vertebral artery at its dural margin with occlusion of the V4 segment beyond PICA. 4. Patent right ICA stent without evidence of in-stent stenosis. Electronically Signed   By: Margaretha Sheffield MD   On: 11/11/2020 12:39   CT ANGIO NECK W OR WO CONTRAST  Result Date: 11/11/2020 CLINICAL DATA:  Stroke/TIA. Right eye loss of vision and right facial numbness for 2 weeks. EXAM: CT ANGIOGRAPHY HEAD AND NECK TECHNIQUE: Multidetector CT imaging of the head and neck was performed using the standard protocol during bolus administration of intravenous contrast. Multiplanar CT image reconstructions and MIPs were obtained to evaluate the vascular anatomy. Carotid stenosis measurements (when applicable) are obtained utilizing NASCET criteria, using the distal internal carotid diameter as the denominator. CONTRAST:  40mL OMNIPAQUE IOHEXOL 350 MG/ML SOLN COMPARISON:  CTA 09/22/2020. FINDINGS: CT HEAD FINDINGS Brain: Please see recent MRI for characterization of infarct in the  right forceps major and evolving right cerebellar infarcts. No evidence of acute hemorrhage, hydrocephalus, extra-axial collection or mass lesion/mass effect. Remote lacunar infarct involving the left pons. Vascular: Calcific atherosclerosis. Skull: No acute fracture. Sinuses: No acute finding. Orbits: No acute finding. Review of the MIP images confirms the above findings CTA NECK FINDINGS Aortic arch: Atherosclerosis of the arch and left subclavian. Great vessel origins are patent. Right carotid system: Interval placement of a stent within the proximal internal carotid artery, which appears patent. No specific evidence of in stent stenosis. Left carotid system: Mild atherosclerosis at the carotid bifurcation and at the skull base without greater than 50% narrowing. Vertebral arteries: Left dominant. Similar moderate stenosis of the right vertebral artery at the atlantooccipital level  (series 517, image 383). Skeleton: C6-C7 ACDF. Similar adjacent level degenerative disc disease at C5-C6 and to a lesser extent C4-C5. Similar mild anterolisthesis of C4 on C5. Other neck: No mass or suspicious adenopathy. Upper chest: No acute findings. Review of the MIP images confirms the above findings CTA HEAD FINDINGS Anterior circulation: Atherosclerotic calcification involving bilateral cavernous centric right arteries. Overall, less than 50% narrowing, similar to prior. Bilateral MCA and ACA arteries are patent without evidence of hemodynamically significant proximal stenosis or large vessel occlusion. Posterior circulation: Similar severe stenosis of the right vertebral artery where it becomes intradural (series 517, image 39). The intradural right vertebral artery is poorly opacified and occluded distal to the PICA. Left intradural vertebral artery and basilar artery are patent. There is severe stenosis of the right distal P2 PCA at a branch point (see series 517, images 500-505). There is moderate stenosis of the proximal left P2 PCA Venous sinuses: As permitted by contrast timing, patent. Review of the MIP images confirms the above findings IMPRESSION: 1. Please see recent MRI for characterization of infarct in the right forceps major and evolving right cerebellar infarcts. Otherwise, no evidence of acute intracranial abnormality. 2. Severe stenosis of the distal right P2 PCA at a branch point and moderate stenosis of the proximal left P2 PCA. 3. Similar severe stenosis of the right vertebral artery at its dural margin with occlusion of the V4 segment beyond PICA. 4. Patent right ICA stent without evidence of in-stent stenosis. Electronically Signed   By: Margaretha Sheffield MD   On: 11/11/2020 12:39   MR BRAIN WO CONTRAST  Result Date: 11/10/2020 CLINICAL DATA:  Right eye vision loss and facial numbness following right carotid stent placement on 10/23/2020. EXAM: MRI HEAD WITHOUT CONTRAST TECHNIQUE:  Multiplanar, multiecho pulse sequences of the brain and surrounding structures were obtained without intravenous contrast. COMPARISON:  Head MRI 09/21/2020 and CTA 09/22/2020 FINDINGS: Brain: Restricted diffusion associated with small acute right cerebellar infarcts on the prior MRI has resolved with only subtle residual T2 hyperintensity. There is a new 5 mm focus of trace diffusion weighted signal hyperintensity in the white matter at the level of the right forceps major with subtly reduced ADC and subtle T2 hyperintensity suggestive of a subacute infarct. A chronic left paramedian pontine infarct is unchanged. There are several new scattered chronic microhemorrhages in the right cerebral hemisphere including in the anterior occipital lobe, superior temporal lobe, and medial frontal lobe without associated restricted diffusion or T2 signal abnormality. Patchy T2 hyperintensity in the pons and scattered punctate foci of T2 hyperintensity in the cerebral white matter bilaterally are unchanged and compatible with mild chronic small vessel ischemic disease. Mild cerebral atrophy is within normal limits for age. Vascular: Abnormal appearance of the  distal right vertebral artery corresponding to the occlusion demonstrated on CTA. Skull and upper cervical spine: Unremarkable bone marrow signal. Sinuses/Orbits: Unremarkable orbits. Mild scattered mucosal thickening in the paranasal sinuses. Clear mastoid air cells. Other: None. IMPRESSION: 1. New 5 mm subacute white matter infarct in the right forceps major. 2. Interval evolution of small right cerebellar infarcts with mild residual signal abnormality. 3. Several new scattered chronic microhemorrhages in the right cerebral hemisphere. 4. Chronic pontine infarct. Electronically Signed   By: Logan Bores M.D.   On: 11/10/2020 18:46     CODE STATUS:     Code Status Orders  (From admission, onward)         Start     Ordered   11/10/20 1754  Full code  Continuous         11/10/20 1753        Code Status History    Date Active Date Inactive Code Status Order ID Comments User Context   10/23/2020 1749 10/24/2020 2119 Full Code [id number]  Katha Cabal, MD Inpatient   09/22/2020 0004 09/23/2020 2322 Full Code [id number]  Ivor Costa, MD Inpatient   Advance Care Planning Activity    Advance Directive Documentation     Most Recent Value  Type of Advance Directive Living will  Pre-existing out of facility DNR order (yellow form or pink MOST form) --  "MOST" Form in Place? --       TOTAL TIME TAKING CARE OF THIS PATIENT: *35* minutes.    Fritzi Mandes M.D  [facility]    CC: Primary care physician; Kirk Ruths, MD

## 2020-11-11 NOTE — Progress Notes (Signed)
Initial Nutrition Assessment ° °DOCUMENTATION CODES:  ° °Not applicable ° °INTERVENTION:  ° °Ensure Max protein supplement BID, each supplement provides 150kcal and 30g of protein. ° °Diabetes diet education  ° °NUTRITION DIAGNOSIS:  ° °Unintentional weight loss related to poor appetite as evidenced by 9 percent weight loss in 4 months. ° °GOAL:  ° °Patient will meet greater than or equal to 90% of their needs ° °MONITOR:  ° °PO intake ° °REASON FOR ASSESSMENT:  ° °Malnutrition Screening Tool °  ° °ASSESSMENT:  ° °69 y.o. 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 Schnier comes to the emergency room with post procedure right facial numbness and right eye visual disturbance.  ° °Met with pt in room today. Pt reports decreased oral intake and weight loss over the past several weeks r/t her recent CVA, stent placement and new medications making her nauseas. Pt also reports that prior to this, she had been intentionally eliminating sugar from her diet because her diabetes had gotten out of control and that she had started losing some weight from this. Per chart, pt is down 13lbs(9%) over the past 4 months; this is significant. Pt reports that her appetite is improving in hospital; pt reports that she is eating 100% of meals in hospital. RD discussed with pt adequate nutrition needed to preserve lean muscle; RD also provided diabetic diet education today. Pt would like to try chocolate Ensure Max in hospital; RD will order.  ° °Medications reviewed and include: plavix, lovenox, insulin, synthroid, aldactone  ° °Labs reviewed:  ° °NUTRITION - FOCUSED PHYSICAL EXAM: ° °  Most Recent Value  °Orbital Region No depletion  °Upper Arm Region Mild depletion  °Thoracic and Lumbar Region No depletion  °Buccal Region No depletion  °Temple Region No  depletion  °Clavicle Bone Region Mild depletion  °Clavicle and Acromion Bone Region Mild depletion  °Scapular Bone Region No depletion  °Dorsal Hand Mild depletion  °Patellar Region Moderate depletion  °Anterior Thigh Region Moderate depletion  °Posterior Calf Region Moderate depletion  °Edema (RD Assessment) None  °Hair Reviewed  °Eyes Reviewed  °Mouth Reviewed  °Skin Reviewed  °Nails Reviewed  °  ° °Diet Order:   °Diet Order   °       °  Diet Heart Room service appropriate? Yes; Fluid consistency: Thin  Diet effective now       °  °  °  °  ° °EDUCATION NEEDS:  ° °Education needs have been addressed ° °Skin:  Skin Assessment: Reviewed RN Assessment ° °Last BM:  11/28 ° °Height:  ° °Ht Readings from Last 1 Encounters:  °11/10/20 5' 5" (1.651 m)  ° ° °Weight:  ° °Wt Readings from Last 1 Encounters:  °11/10/20 62.1 kg  ° ° °Ideal Body Weight:  56.8 kg ° °BMI:  Body mass index is 22.8 kg/m². ° °Estimated Nutritional Needs:  ° °Kcal:  1400-1600kcal/day ° °Protein:  70-80g/day ° °Fluid:  >1.4L/day ° °Casey Campbell MS, RD, LDN °Please refer to [facility] for RD and/or RD on-call/weekend/after hours pager ° ° °

## 2020-11-11 NOTE — Evaluation (Signed)
Occupational Therapy Evaluation Patient Details Name: Latoya Herman MRN: [id number] DOB: 10-22-1951 Today's Date: 11/11/2020    History of Present Illness Pt is a 69 y.o. 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 Delana Meyer comes to the emergency room with post procedure right facial numbness and right eye visual disturbance. MRI stated "New 5 mm subacute white matter infarct in the right forceps major. Interval evolution of small right cerebellar infarcts with mild residual signal abnormality. several new scattered chronic microhemorrhages in the right cerebral hemisphere including in the anterior occipital lobe, superior temporal lobe, and medial frontal lobe without associated restricted diffusion orT2 signal abnormality.Chronic pontine infarct.   Clinical Impression   Pt reporting being mod I with use of SPC and family present to assist as needed PTA. 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 Pt able to demonstrate self care tasks, balance, and functional transfers with supervision - mod I level. Pt with no skilled need for OT intervention at this time. Pt agrees with this. Pt is asking to resume HHPT services. All needs within reach and pt seated on EOB with family member present in the room. NO LOB this session. OT will be SIGNING OFF. Thank you for referral.     Follow Up Recommendations  No OT follow up;Supervision - Intermittent    Equipment Recommendations  None recommended by OT       Precautions / Restrictions Precautions Precautions: Fall      Mobility Bed Mobility Overal bed mobility: Modified Independent       Transfers Overall transfer level: Needs assistance Equipment used: Straight cane Transfers: Sit to/from Stand;Stand Pivot Transfers Sit to Stand:  Modified independent (Device/Increase time) Stand pivot transfers: Supervision            Balance Overall balance assessment: Needs assistance Sitting-balance support: No upper extremity supported Sitting balance-Leahy Scale: Normal       Standing balance-Leahy Scale: Good            ADL either performed or assessed with clinical judgement   ADL Overall ADL's : Modified independent          General ADL Comments: Pt at supervision - mod I overall for all functional self care tasks and transfers     Vision Patient Visual Report: Other (comment) (Pt reports blindness in R eye. Can't see shadows)              Pertinent Vitals/Pain Pain Assessment: No/denies pain     Hand Dominance Right   Extremity/Trunk Assessment Upper Extremity Assessment Upper Extremity Assessment: Overall WFL for tasks assessed   Lower Extremity Assessment Lower Extremity Assessment: Overall WFL for tasks assessed   Cervical / Trunk Assessment Cervical / Trunk Assessment: Normal   Communication Communication Communication: No difficulties   Cognition Arousal/Alertness: Awake/alert Behavior During Therapy: WFL for tasks assessed/performed Overall Cognitive Status: Within Functional Limits for tasks assessed                   Home Living Family/patient expects to be discharged to:: Private residence Living Arrangements: Spouse/significant other Available Help at Discharge: Family Type of Home: House Home Access: Stairs to enter Technical brewer of Steps: 3 Entrance Stairs-Rails: Left Home Layout: Two level;Able to live on main level with bedroom/bathroom Alternate Level Stairs-Number of Steps: 13  Bathroom Shower/Tub: Gaffer;Door   Bathroom Toilet: Handicapped height     Home Equipment: Cane - single point;Grab bars - tub/shower;Walker - 4 wheels;Cane - quad;Grab bars - toilet;Shower seat - built in   Additional Comments: Pt has had two falls in the last  6 months      Prior Functioning/Environment Level of Independence: Independent with assistive device(s)        Comments: Has been using a cane at HHPT recomendation                OT Goals(Current goals can be found in the care plan section) Acute Rehab OT Goals Patient Stated Goal: to go home OT Goal Formulation: With patient Time For Goal Achievement: 11/25/20 Potential to Achieve Goals: Good  OT Frequency:                AM-PAC OT "6 Clicks" Daily Activity     Outcome Measure Help from another person eating meals?: None Help from another person taking care of personal grooming?: None Help from another person toileting, which includes using toliet, bedpan, or urinal?: A Little Help from another person bathing (including washing, rinsing, drying)?: None Help from another person to put on and taking off regular upper body clothing?: None Help from another person to put on and taking off regular lower body clothing?: A Little 6 Click Score: 22   End of Session Equipment Utilized During Treatment: Other (comment) (SPC)  Activity Tolerance: Patient tolerated treatment well Patient left: in bed;with call bell/phone within reach;with family/visitor present                   Time: 0922-0942 OT Time Calculation (min): 20 min Charges:  OT General Charges $OT Visit: 1 Visit OT Evaluation $OT Eval Low Complexity: 1 Low OT Treatments $Self Care/Home Management : 8-22 mins  Darleen Crocker, MS, OTR/L , CBIS ascom [phone]  11/11/20, 12:25 PM

## 2020-11-11 NOTE — Evaluation (Signed)
Physical Therapy Evaluation Patient Details Name: Latoya Herman MRN: [id number] DOB: 1951/02/16 Today's Date: 11/11/2020   History of Present Illness  Pt is a 69 y.o. 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 Delana Meyer comes to the emergency room with post procedure right facial numbness and right eye visual disturbance. MRI stated "New 5 mm subacute white matter infarct in the right forceps major. Interval evolution of small right cerebellar infarcts with mild residual signal abnormality. several new scattered chronic microhemorrhages in the right cerebral hemisphere including in the anterior occipital lobe, superior temporal lobe, and medial frontal lobe without associated restricted diffusion orT2 signal abnormality.Chronic pontine infarct.    Clinical Impression  Pt A&Ox4, no pain to report. Pt with family at bedside. Pt reported that her vision remains unchanged since hospital admission. Previously modI with [facility] or rollator as needed at home since surgery, receiving HHPT. Has had two falls in the last 6 months. ModI/I for ADLs, husband assists with IADLs as needed.   Upon assessment the patient demonstrated impaired R vision; decreased peripheral fields, tracking to R (at midline and to L [facility]), but no complaints of double vision noted. Mobility assessed, pt performed bed mobility modI, and sit <> stand with SPC as well as ambulation with supervision. Occasional gait path deviations noted as well as decreased gait velocity, pt seemed to recall to scan environment to compensate for visual deficits ~60% of the time.  Overall the patient demonstrated deficits (see "PT Problem List") that impede the patient's safety and mobility and would benefit from skilled PT intervention. Recommendation is HHPT to maximize  compensational strategies and decrease risk of falls.     Follow Up Recommendations Home health PT    Equipment Recommendations  None recommended by PT    Recommendations for Other Services       Precautions / Restrictions Precautions Precautions: Fall      Mobility  Bed Mobility Overal bed mobility: Modified Independent                  Transfers Overall transfer level: Needs assistance Equipment used: Straight cane Transfers: Sit to/from Stand Sit to Stand: Supervision            Ambulation/Gait   Gait Distance (Feet): 170 Feet Assistive device: Straight cane     Gait velocity interpretation: 1.31 - 2.62 ft/sec, indicative of limited community ambulator General Gait Details: occasionaly gait path deviations noted, pt able to turn her head and scan environment to compensate for visual concerns ~60% of the time  Stairs            Wheelchair Mobility    Modified Rankin (Stroke Patients Only)       Balance Overall balance assessment: Needs assistance Sitting-balance support: No upper extremity supported Sitting balance-Leahy Scale: Normal       Standing balance-Leahy Scale: Good                               Pertinent Vitals/Pain Pain Assessment: No/denies pain    Home Living Family/patient expects to be discharged to:: Private residence Living Arrangements: Spouse/significant other Available Help at Discharge: Family Type of Home: House Home Access: Stairs to enter Entrance Stairs-Rails: Left Entrance Stairs-Number of Steps: 3 Home Layout: Two level;Able to live  on main level with bedroom/bathroom Home Equipment: Cane - single point;Grab bars - tub/shower;Walker - 4 wheels;Cane - quad;Grab bars - toilet;Shower seat - built in Additional Comments: Pt has had two falls in the last 6 months    Prior Function Level of Independence: Independent with assistive device(s)         Comments: Has been using a cane at HHPT  recomendation     Hand Dominance   Dominant Hand: Right    Extremity/Trunk Assessment   Upper Extremity Assessment Upper Extremity Assessment: Overall WFL for tasks assessed    Lower Extremity Assessment Lower Extremity Assessment: Overall WFL for tasks assessed    Cervical / Trunk Assessment Cervical / Trunk Assessment: Normal  Communication   Communication: No difficulties  Cognition Arousal/Alertness: Awake/alert Behavior During Therapy: WFL for tasks assessed/performed Overall Cognitive Status: Within Functional Limits for tasks assessed                                        General Comments      Exercises     Assessment/Plan    PT Assessment Patient needs continued PT services  PT Problem List Decreased balance;Decreased mobility       PT Treatment Interventions Balance training;Gait training;Therapeutic activities;Therapeutic exercise;Patient/family education;Neuromuscular re-education;Stair training;Functional mobility training;DME instruction    PT Goals (Current goals can be found in the Care Plan section)  Acute Rehab PT Goals Patient Stated Goal: to go home PT Goal Formulation: With patient Time For Goal Achievement: 11/25/20 Potential to Achieve Goals: Good    Frequency Min 2X/week   Barriers to discharge        Co-evaluation               AM-PAC PT "6 Clicks" Mobility  Outcome Measure Help needed turning from your back to your side while in a flat bed without using bedrails?: None Help needed moving from lying on your back to sitting on the side of a flat bed without using bedrails?: None Help needed moving to and from a bed to a chair (including a wheelchair)?: None Help needed standing up from a chair using your arms (e.g., wheelchair or bedside chair)?: None Help needed to walk in hospital room?: None Help needed climbing 3-5 steps with a railing? : A Little 6 Click Score: 23    End of Session Equipment  Utilized During Treatment: Gait belt Activity Tolerance: Patient tolerated treatment well Patient left: with family/visitor present;in bed;with call bell/phone within reach;with bed alarm set Nurse Communication: Mobility status PT Visit Diagnosis: Other abnormalities of gait and mobility (R26.89);Other symptoms and signs involving the nervous system (R29.898)    Time: 2505-3976 PT Time Calculation (min) (ACUTE ONLY): 21 min   Charges:   PT Evaluation $PT Eval Low Complexity: 1 Low          Lieutenant Diego PT, DPT 10:51 AM,11/11/20

## 2020-11-11 NOTE — Progress Notes (Signed)
Discharge instructions reviewed with the patient and her daughter. Patient sent out via wheelchair to her daughters car. °

## 2020-11-11 NOTE — Care Management (Signed)
Patient to discharge home today Patient active with home health PT through [facility].   Patient will not require resumption of care orders due to observation status.   [name] with [facility] notified

## 2020-11-20 ENCOUNTER — Other Ambulatory Visit (INDEPENDENT_AMBULATORY_CARE_PROVIDER_SITE_OTHER): Payer: Self-pay | Admitting: Vascular Surgery

## 2020-11-20 DIAGNOSIS — Z959 Presence of cardiac and vascular implant and graft, unspecified: Secondary | ICD-10-CM

## 2020-11-20 DIAGNOSIS — I6523 Occlusion and stenosis of bilateral carotid arteries: Secondary | ICD-10-CM

## 2020-11-25 ENCOUNTER — Other Ambulatory Visit: Payer: Self-pay

## 2020-11-25 ENCOUNTER — Encounter (INDEPENDENT_AMBULATORY_CARE_PROVIDER_SITE_OTHER): Payer: Self-pay | Admitting: Vascular Surgery

## 2020-11-25 ENCOUNTER — Ambulatory Visit (INDEPENDENT_AMBULATORY_CARE_PROVIDER_SITE_OTHER): Payer: Medicare HMO | Admitting: Vascular Surgery

## 2020-11-25 ENCOUNTER — Ambulatory Visit (INDEPENDENT_AMBULATORY_CARE_PROVIDER_SITE_OTHER): Payer: Medicare HMO

## 2020-11-25 VITALS — BP 196/100 | HR 83 | Resp 16 | Wt 130.4 lb

## 2020-11-25 DIAGNOSIS — Z959 Presence of cardiac and vascular implant and graft, unspecified: Secondary | ICD-10-CM | POA: Diagnosis not present

## 2020-11-25 DIAGNOSIS — I6523 Occlusion and stenosis of bilateral carotid arteries: Secondary | ICD-10-CM | POA: Diagnosis not present

## 2020-11-25 DIAGNOSIS — I63239 Cerebral infarction due to unspecified occlusion or stenosis of unspecified carotid arteries: Secondary | ICD-10-CM

## 2020-11-25 NOTE — Progress Notes (Signed)
Patient ID: Latoya Herman, female   DOB: Aug 14, 1951, 69 y.o.   MRN: [id number]  Chief Complaint  Patient presents with  . Follow-up    [facility] 1 month carotid    HPI Latoya Herman is a 69 y.o. female.    The patient returns for follow-up status post right carotid stenting October 23, 2020.  Upon entering the exam room the patient states "why did not you tell me I was blind in my right eye".  She was seen in the emergency room approximately 2 weeks status post procedure and found to have blindness of the right eye.  MRI demonstrated a new right hemispheric ischemic stroke.   Past Medical History:  Diagnosis Date  . Chronic kidney disease   . Coronary artery disease   . CVA (cerebral vascular accident) ([place]) 09/21/2020  . Diabetes mellitus without complication ([place])   . Hyperlipidemia   . Hypertension   . SOBOE (shortness of breath on exertion)     Past Surgical History:  Procedure Laterality Date  . CAROTID PTA/STENT INTERVENTION Right 10/23/2020   Procedure: CAROTID PTA/STENT INTERVENTION;  Surgeon: Katha Cabal, MD;  Location: [facility];  Service: Cardiovascular;  Laterality: Right;  . COLONOSCOPY  04/23/2010  . COLONOSCOPY WITH ESOPHAGOGASTRODUODENOSCOPY (EGD)  04/23/2010  . COLONOSCOPY WITH PROPOFOL N/A 08/23/2020   Procedure: COLONOSCOPY WITH PROPOFOL;  Surgeon: Lesly Rubenstein, MD;  Location: [facility] ENDOSCOPY;  Service: Endoscopy;  Laterality: N/A;  . CORONARY ANGIOPLASTY WITH STENT PLACEMENT  10/19/2013  . SUPERFICIAL LYMPH NODE BIOPSY / EXCISION  2013      Allergies  Allergen Reactions  . Penicillins Swelling and Other (See Comments)  . Lisinopril Rash    Possible rash     Current Outpatient Medications  Medication Sig Dispense Refill  . aspirin 81 MG chewable tablet Chew 1 tablet (81 mg total) by mouth daily. 30 tablet 0  . atorvastatin (LIPITOR) 80 MG tablet Take 80 mg by mouth daily.    . clopidogrel (PLAVIX) 75 MG tablet Take 75 mg  by mouth daily at 12 noon.     . Ensure Max Protein (ENSURE MAX PROTEIN) LIQD Take 330 mLs (11 oz total) by mouth 2 (two) times daily. 237 mL 1  . gabapentin (NEURONTIN) 100 MG capsule Take 100 mg by mouth at bedtime.     Marland Kitchen levothyroxine (SYNTHROID) 75 MCG tablet Take 75 mcg by mouth daily before breakfast.    . metFORMIN (GLUCOPHAGE) 1000 MG tablet Take 1,000 mg by mouth 2 (two) times daily with a meal.    . metoprolol succinate (TOPROL-XL) 50 MG 24 hr tablet Take 1 tablet (50 mg total) by mouth daily. Take with or immediately following a meal. 30 tablet 1  . spironolactone (ALDACTONE) 25 MG tablet Take 25 mg by mouth daily.     No current facility-administered medications for this visit.        Physical Exam BP (!) 196/100 (BP Location: Right Arm)   Pulse 83   Resp 16   Wt 130 lb 6.4 oz (59.1 kg)   BMI 21.70 kg/m  Gen:  WD/WN, NAD Skin: Neurologic she is moving all extremities with 5 out of 5 strength her speech is fluent   Assessment/Plan: 1. Symptomatic carotid artery stenosis with infarction [facility]) The patient has stated repeatedly she does not wish to follow-up in this office.  I have offered to refer to a new vascular surgeon and have stressed the fact that her stent  needs to be followed as well as the contralateral carotid artery.  She has refused this assistance stating she is "blind in her eye what does not matter".  I attempted to discuss with her the importance of follow-up I also included in my discussion the importance of Plavix and aspirin.  She demanded that this discussion be conveyed to her primary care physician.  Again I have urged the patient to establish with a vascular surgeon I have offered to help with referrals and she refused this assistance.     Hortencia Pilar 11/25/2020, 7:44 PM   This note was created with Dragon medical transcription system.  Any errors from dictation are unintentional.

## 2020-12-21 IMAGING — CT CT ANGIO NECK
1 of 13 series · 4 of 33 positions shown · IV contrast (APPLIED)
Comparison: MR head without contrast 09/21/2020.

CLINICAL DATA: Stroke/TIA. Sudden onset of dizziness and
lightheadedness yesterday. Symptoms lasted for 1 hour. Abnormal MRI.
Early subacute ischemic infarcts of the right cerebellum.

EXAM:
CT ANGIOGRAPHY HEAD AND NECK
TECHNIQUE: Multidetector CT imaging of the head and neck was performed using
the standard protocol during bolus administration of intravenous
contrast. Multiplanar CT image reconstructions and MIPs were
obtained to evaluate the vascular anatomy. Carotid stenosis
measurements (when applicable) are obtained utilizing NASCET
criteria, using the distal internal carotid diameter as the
denominator.
CONTRAST:  75mL OMNIPAQUE IOHEXOL 350 MG/ML SOLN

[Series 511: cta head neck thins · axial · 0.45mm/px · z∈[-280,-78]mm · 4 of 674 slices shown]
[im 135/674  soft-tissue]
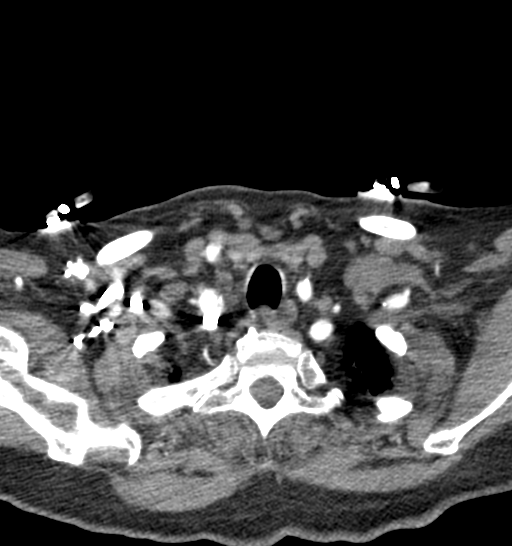
[im 270/674  bone]
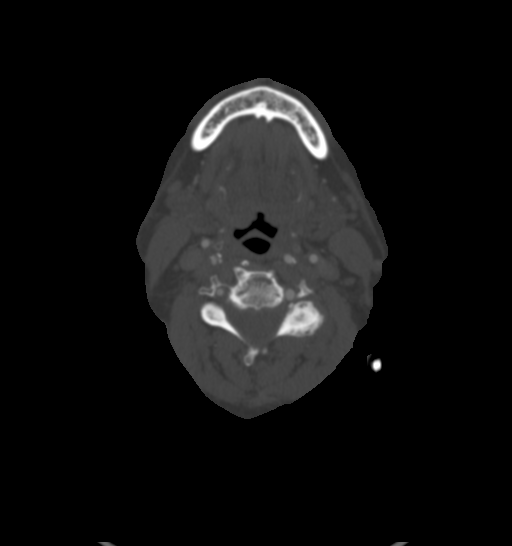
[im 404/674  soft-tissue]
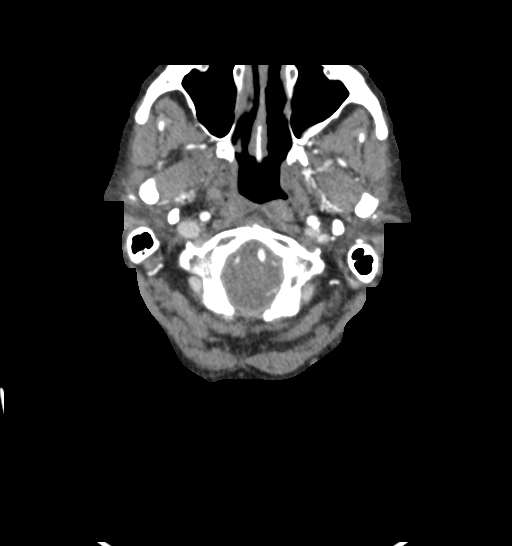
[im 539/674  bone]
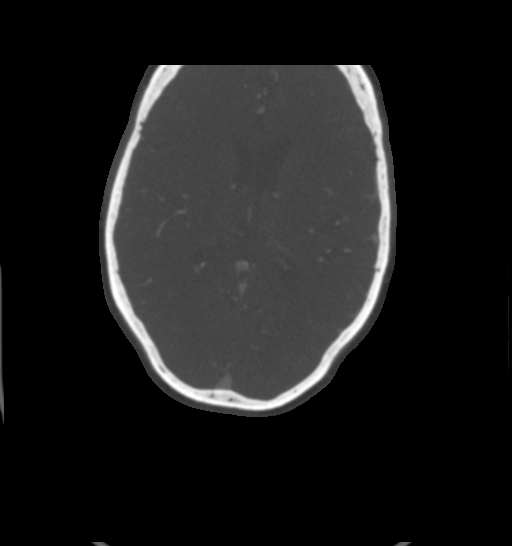

[4 of 33 positions shown; findings below may reference images not displayed]

FINDINGS: Brain: Right cerebellar infarcts are less well seen than on the MRI.
No new infarcts are present. No acute hemorrhage or mass lesion is
present. The ventricles are of normal size. No significant
extraaxial fluid collection is present.

Vascular: Extensive atherosclerotic calcifications are present
within the cavernous internal carotid arteries bilaterally. No
hyperdense vessel is present.

Skull: Calvarium is intact. No focal lytic or blastic lesions are
present. No significant extracranial soft tissue lesion is present.

Sinuses: The paranasal sinuses and mastoid air cells are clear.

Orbits: The globes and orbits are within normal limits.

CTA NECK FINDINGS

Aortic arch: Atherosclerotic calcifications are present within the
distal arch including the origin of the left subclavian artery. No
significant stenosis is present. Common origin of the left common
carotid artery and innominate artery is noted.

Right carotid system: Right common carotid artery is within normal
limits. The proximal right ICA is narrowed to 1 mm. More distal
cervical right ICA is within normal limits.

Left carotid system: Left common carotid artery demonstrates some
mural calcification without significant stenosis. Atherosclerotic
changes are present at the bifurcation. No significant stenosis is
present relative to the more distal vessel. Atherosclerotic
calcifications are present along the wall of distal right ICA, just
below the skull base.

Vertebral arteries: The left vertebral artery is dominant vessel.
Both vertebral arteries originate from the subclavian arteries.
Segmental narrowing is present in the distal right P2 segment.
High-grade stenosis is present in the right vertebral artery at the
dural margin.

Skeleton: Anterior fusion is present at C6-7. Adjacent level
degenerative changes are present C5-6 and to lesser extent at C4-5.
Slight anterolisthesis present at C4-5. Straightening of the normal
cervical lordosis is present.

Other neck: The soft tissues the neck are otherwise unremarkable.

Upper chest: Lung apices are clear.

Review of the MIP images confirms the above findings

CTA HEAD FINDINGS

Anterior circulation: Atherosclerotic calcifications are present
within the cavernous internal carotid arteries bilaterally. Mild
narrowing of less than 50% is present bilaterally. ICA termini
within normal limits. The A1 and M1 segments are normal. The
anterior communicating artery is patent. MCA bifurcations are within
normal limits. ACA and MCA branch vessels are within normal limits.

Posterior circulation: Left vertebral artery is dominant vessel.
Left PICA origin is visualized and normal. High-grade stenosis is
present at the dural margin of the right vertebral artery. The right
V4 segment is occluded beyond the PICA. Basilar artery is normal.
Both posterior cerebral arteries originate from the basilar tip. PCA
branch vessels are within normal limits.

Venous sinuses: Dural sinuses are patent. The straight sinus deep
cerebral veins are intact. Cortical veins are within normal limits.

Anatomic variants: None

Review of the MIP images confirms the above findings
IMPRESSION: 1. Occlusion of the right V4 segment beyond the PICA.
2. High-grade, near occlusive stenosis of the proximal right
internal carotid artery measuring up to 1 mm.
3. High-grade stenosis of the right vertebral artery at the dural
margin.
4. Atherosclerotic changes at the left carotid bifurcation and
cavernous internal carotid arteries bilaterally without significant
stenosis relative to the more distal vessels.
5. No other significant proximal stenosis, aneurysm, or branch
vessel occlusion within the Circle of Willis.
6. Multilevel spondylosis of the cervical spine.
7. Aortic Atherosclerosis ([id number]).

## 2021-10-08 ENCOUNTER — Other Ambulatory Visit: Payer: Self-pay | Admitting: Internal Medicine

## 2021-10-08 DIAGNOSIS — Z1231 Encounter for screening mammogram for malignant neoplasm of breast: Secondary | ICD-10-CM

## 2023-02-26 ENCOUNTER — Other Ambulatory Visit: Payer: Self-pay | Admitting: Internal Medicine

## 2023-02-26 DIAGNOSIS — Z1231 Encounter for screening mammogram for malignant neoplasm of breast: Secondary | ICD-10-CM

## 2023-03-18 ENCOUNTER — Ambulatory Visit
Admission: RE | Admit: 2023-03-18 | Discharge: 2023-03-18 | Disposition: A | Discharge status: Home / Self Care | Payer: Medicare HMO | Source: Ambulatory Visit | Attending: Internal Medicine | Admitting: Internal Medicine

## 2023-03-18 DIAGNOSIS — Z1231 Encounter for screening mammogram for malignant neoplasm of breast: Secondary | ICD-10-CM | POA: Diagnosis present

## 2024-09-25 ENCOUNTER — Encounter: Payer: Self-pay | Admitting: Ophthalmology

## 2024-09-25 NOTE — Discharge Instructions (Signed)

## 2024-09-25 NOTE — Anesthesia Preprocedure Evaluation (Addendum)
 Anesthesia Evaluation  Patient identified by MRN, date of birth, ID band Patient awake    Reviewed: Allergy & Precautions, H&P , NPO status , Patient's Chart, lab work & pertinent test results  Airway Mallampati: III  TM Distance: <3 FB Neck ROM: Full    Dental no notable dental hx.    Pulmonary neg pulmonary ROS, sleep apnea    Pulmonary exam normal breath sounds clear to auscultation       Cardiovascular hypertension, + CAD and + Peripheral Vascular Disease  negative cardio ROS Normal cardiovascular exam Rhythm:Regular Rate:Normal   coronary artery disease s/p PCI to the LAD in 11/14   Echocardiogram through [facility] on 09/22/20 revealed normal RV and LV systolic function with an EF estimated between 55-60% with grade 1 diastolic dysfunction  S/p pci and drug eluting stent of LAD 10/2013   Neuro/Psych  PSYCHIATRIC DISORDERS    Schizophrenia  admitted to [facility] from 09/21/20 through 09/23/20 for dizziness with near syncope. Brain MRI confirmed an acute, right cerebellar stroke.   Neuromuscular disease CVA negative neurological ROS  negative psych ROS   GI/Hepatic negative GI ROS, Neg liver ROS,,,  Endo/Other  negative endocrine ROSdiabetesHypothyroidism Hyperthyroidism   Renal/GU Renal diseasenegative Renal ROS  negative genitourinary   Musculoskeletal negative musculoskeletal ROS (+)    Abdominal   Peds negative pediatric ROS (+)  Hematology negative hematology ROS (+)   Anesthesia Other Findings Medical History  Coronary artery disease Hypertension Hyperlipidemia  SOBOE (shortness of breath on exertion) Diabetes mellitus without complication Chronic kidney disease CVA (cerebral vascular accident)  Sleep apnea Hyperthyroidism  Neuromuscular disorder (HCC) Blind right eye  Stage 3a chronic kidney disease (CKD) (HCC) Acquired hypothyroidism  Right-sided carotid artery disease, unspecified type Valvular  heart disease  H/O heart artery stent Grade I diastolic dysfunction  Claudication of both lower extremities Schizophrenia (HCC)  Status post insertion of drug-eluting stent into left anterior descending (LAD) artery for coronary artery disease PVD (peripheral vascular disease) with claudication     Reproductive/Obstetrics negative OB ROS                              Anesthesia Physical Anesthesia Plan  ASA: 3  Anesthesia Plan: General   Post-op Pain Management:    Induction: Inhalational  PONV Risk Score and Plan:   Airway Management Planned: Oral ETT  Additional Equipment:   Intra-op Plan:   Post-operative Plan: Extubation in OR  Informed Consent: I have reviewed the patients History and Physical, chart, labs and discussed the procedure including the risks, benefits and alternatives for the proposed anesthesia with the patient or authorized representative who has indicated his/her understanding and acceptance.     Dental Advisory Given  Plan Discussed with: Anesthesiologist, CRNA and Surgeon  Anesthesia Plan Comments: (Parent consented for risks of anesthesia including but not limited to:  - adverse reactions to medications - damage to eyes, teeth, lips or other oral mucosa including nose bleeds - nerve damage due to positioning  - sore throat or hoarseness - Damage to heart, brain, nerves, lungs, other parts of body or loss of life  Parent voiced understanding and assent.  )         Anesthesia Quick Evaluation

## 2024-09-27 ENCOUNTER — Ambulatory Visit: Payer: Self-pay | Admitting: Anesthesiology

## 2024-09-27 ENCOUNTER — Ambulatory Visit
Admission: RE | Admit: 2024-09-27 | Discharge: 2024-09-27 | Disposition: A | Discharge status: Home / Self Care | Attending: Ophthalmology | Admitting: Ophthalmology

## 2024-09-27 ENCOUNTER — Encounter
Admission: RE | Disposition: A | Discharge status: Home / Self Care | Payer: Self-pay | Source: Home / Self Care | Attending: Ophthalmology

## 2024-09-27 ENCOUNTER — Other Ambulatory Visit: Payer: Self-pay

## 2024-09-27 ENCOUNTER — Encounter: Payer: Self-pay | Admitting: Anesthesiology

## 2024-09-27 ENCOUNTER — Encounter: Payer: Self-pay | Admitting: Ophthalmology

## 2024-09-27 DIAGNOSIS — Z79890 Hormone replacement therapy (postmenopausal): Secondary | ICD-10-CM | POA: Diagnosis not present

## 2024-09-27 DIAGNOSIS — E1122 Type 2 diabetes mellitus with diabetic chronic kidney disease: Secondary | ICD-10-CM | POA: Insufficient documentation

## 2024-09-27 DIAGNOSIS — E0590 Thyrotoxicosis, unspecified without thyrotoxic crisis or storm: Secondary | ICD-10-CM | POA: Diagnosis not present

## 2024-09-27 DIAGNOSIS — Z8673 Personal history of transient ischemic attack (TIA), and cerebral infarction without residual deficits: Secondary | ICD-10-CM | POA: Insufficient documentation

## 2024-09-27 DIAGNOSIS — H2512 Age-related nuclear cataract, left eye: Secondary | ICD-10-CM | POA: Insufficient documentation

## 2024-09-27 DIAGNOSIS — E1136 Type 2 diabetes mellitus with diabetic cataract: Secondary | ICD-10-CM | POA: Insufficient documentation

## 2024-09-27 DIAGNOSIS — Z955 Presence of coronary angioplasty implant and graft: Secondary | ICD-10-CM | POA: Insufficient documentation

## 2024-09-27 DIAGNOSIS — I129 Hypertensive chronic kidney disease with stage 1 through stage 4 chronic kidney disease, or unspecified chronic kidney disease: Secondary | ICD-10-CM | POA: Insufficient documentation

## 2024-09-27 DIAGNOSIS — G4730 Sleep apnea, unspecified: Secondary | ICD-10-CM | POA: Diagnosis not present

## 2024-09-27 DIAGNOSIS — Z79899 Other long term (current) drug therapy: Secondary | ICD-10-CM | POA: Insufficient documentation

## 2024-09-27 DIAGNOSIS — N1831 Chronic kidney disease, stage 3a: Secondary | ICD-10-CM | POA: Diagnosis not present

## 2024-09-27 HISTORY — DX: Presence of coronary angioplasty implant and graft: Z95.5

## 2024-09-27 HISTORY — DX: Sleep apnea, unspecified: G47.30

## 2024-09-27 HISTORY — DX: Hypothyroidism, unspecified: E03.9

## 2024-09-27 HISTORY — DX: Schizophrenia, unspecified: F20.9

## 2024-09-27 HISTORY — DX: Blindness, one eye, unspecified eye: H54.40

## 2024-09-27 HISTORY — DX: Myoneural disorder, unspecified: G70.9

## 2024-09-27 HISTORY — DX: Other ill-defined heart diseases: I51.89

## 2024-09-27 HISTORY — DX: Disorder of arteries and arterioles, unspecified: I77.9

## 2024-09-27 HISTORY — PX: CATARACT EXTRACTION W/PHACO: SHX586

## 2024-09-27 HISTORY — DX: Peripheral vascular disease, unspecified: I73.9

## 2024-09-27 HISTORY — DX: Thyrotoxicosis, unspecified without thyrotoxic crisis or storm: E05.90

## 2024-09-27 HISTORY — DX: Chronic kidney disease, stage 3a: N18.31

## 2024-09-27 LAB — GLUCOSE, CAPILLARY: Glucose-Capillary: 128 mg/dL — ABNORMAL HIGH (ref 70–99)

## 2024-09-27 SURGERY — PHACOEMULSIFICATION, CATARACT, WITH IOL INSERTION
Anesthesia: General | Site: Eye | Laterality: Left

## 2024-09-27 MED ORDER — ARMC OPHTHALMIC DILATING DROPS
OPHTHALMIC | Status: AC
  Filled 2024-09-27: qty 0.5

## 2024-09-27 MED ORDER — FENTANYL CITRATE (PF) 100 MCG/2ML IJ SOLN
INTRAMUSCULAR | Status: DC | PRN
  Administered 2024-09-27 (×2): 50 ug via INTRAVENOUS

## 2024-09-27 MED ORDER — TETRACAINE HCL 0.5 % OP SOLN
1.0000 [drp] | OPHTHALMIC | Status: DC | PRN
  Administered 2024-09-27 (×3): 1 [drp] via OPHTHALMIC

## 2024-09-27 MED ORDER — LACTATED RINGERS IV SOLN: INTRAVENOUS | Status: DC

## 2024-09-27 MED ORDER — SIGHTPATH DOSE#1 BSS IO SOLN
INTRAOCULAR | Status: DC | PRN
Start: 2024-09-27 — End: 2024-09-27
  Administered 2024-09-27: 15 mL via INTRAOCULAR

## 2024-09-27 MED ORDER — DEXMEDETOMIDINE HCL IN NACL 200 MCG/50ML IV SOLN
INTRAVENOUS | Status: DC | PRN
  Administered 2024-09-27: 12 ug via INTRAVENOUS

## 2024-09-27 MED ORDER — SIGHTPATH DOSE#1 NA HYALUR & NA CHOND-NA HYALUR IO KIT
PACK | INTRAOCULAR | Status: DC | PRN
  Administered 2024-09-27: 1 via OPHTHALMIC

## 2024-09-27 MED ORDER — MIDAZOLAM HCL 2 MG/2ML IJ SOLN
INTRAMUSCULAR | Status: AC
  Filled 2024-09-27: qty 2

## 2024-09-27 MED ORDER — MIDAZOLAM HCL 2 MG/2ML IJ SOLN
INTRAMUSCULAR | Status: DC | PRN
  Administered 2024-09-27 (×2): 1 mg via INTRAVENOUS

## 2024-09-27 MED ORDER — LIDOCAINE HCL (PF) 2 % IJ SOLN
INTRAOCULAR | Status: DC | PRN
  Administered 2024-09-27: 2 mL

## 2024-09-27 MED ORDER — ARMC OPHTHALMIC DILATING DROPS
1.0000 | OPHTHALMIC | Status: DC | PRN
  Administered 2024-09-27 (×3): 1 via OPHTHALMIC

## 2024-09-27 MED ORDER — TETRACAINE HCL 0.5 % OP SOLN
OPHTHALMIC | Status: AC
  Filled 2024-09-27: qty 4

## 2024-09-27 MED ORDER — FENTANYL CITRATE (PF) 100 MCG/2ML IJ SOLN
INTRAMUSCULAR | Status: AC
  Filled 2024-09-27: qty 2

## 2024-09-27 MED ORDER — SIGHTPATH DOSE#1 BSS IO SOLN
INTRAOCULAR | Status: DC | PRN
  Administered 2024-09-27: 66 mL via OPHTHALMIC

## 2024-09-27 MED ORDER — MOXIFLOXACIN HCL 0.5 % OP SOLN
OPHTHALMIC | Status: DC | PRN
  Administered 2024-09-27: .2 mL via OPHTHALMIC

## 2024-09-27 MED ORDER — BRIMONIDINE TARTRATE-TIMOLOL 0.2-0.5 % OP SOLN
OPHTHALMIC | Status: DC | PRN
  Administered 2024-09-27: 1 [drp] via OPHTHALMIC

## 2024-09-27 SURGICAL SUPPLY — 8 items
FEE CATARACT SUITE SIGHTPATH (MISCELLANEOUS) ×2 IMPLANT
GLOVE BIOGEL PI IND STRL 8 (GLOVE) ×2 IMPLANT
GLOVE SURG LX STRL 7.5 STRW (GLOVE) ×2 IMPLANT
GLOVE SURG SYN 6.5 PF PI BL (GLOVE) ×2 IMPLANT
LENS IOL TECNIS EYHANCE 21.5 (Intraocular Lens) IMPLANT
NDL FILTER BLUNT 18X1 1/2 (NEEDLE) ×2 IMPLANT
NEEDLE FILTER BLUNT 18X1 1/2 (NEEDLE) ×1 IMPLANT
SYR 3ML LL SCALE MARK (SYRINGE) ×2 IMPLANT

## 2024-09-27 NOTE — Anesthesia Postprocedure Evaluation (Signed)
 Anesthesia Post Note  Patient: Latoya Herman  Procedure(s) Performed: PHACOEMULSIFICATION, CATARACT, WITH IOL INSERTION 11.19 01:06.4 (Left: Eye)  Patient location during evaluation: PACU Anesthesia Type: General Level of consciousness: awake and alert Pain management: pain level controlled Vital Signs Assessment: post-procedure vital signs reviewed and stable Respiratory status: spontaneous breathing, nonlabored ventilation, respiratory function stable and patient connected to nasal cannula oxygen Cardiovascular status: stable and blood pressure returned to baseline Postop Assessment: no apparent nausea or vomiting Anesthetic complications: no   No notable events documented.   Last Vitals:  Vitals:   09/27/24 1207 09/27/24 1211  BP: (!) 107/50 (!) 96/50  Pulse: (!) 52 (!) 50  Resp: (!) 23 20  Temp: (!) 36.1 C (!) 36.1 C  SpO2: 97% 96%    Last Pain:  Vitals:   09/27/24 1211  TempSrc:   PainSc: 0-No pain                 Artrice Kraker C Jatara Huettner

## 2024-09-27 NOTE — Transfer of Care (Signed)
 Immediate Anesthesia Transfer of Care Note  Patient: Latoya Herman  Procedure(s) Performed: PHACOEMULSIFICATION, CATARACT, WITH IOL INSERTION 11.19 01:06.4 (Left: Eye)  Patient Location: PACU  Anesthesia Type: General  Level of Consciousness: awake, alert  and patient cooperative  Airway and Oxygen Therapy: Patient Spontanous Breathing   Post-op Assessment: Post-op Vital signs reviewed, Patient's Cardiovascular Status Stable, Respiratory Function Stable, Patent Airway and No signs of Nausea or vomiting  Post-op Vital Signs: Reviewed and stable  Complications: No notable events documented.

## 2024-09-27 NOTE — Op Note (Signed)
 OPERATIVE NOTE  AHLANA SLAYDON [id number] 09/27/2024   PREOPERATIVE DIAGNOSIS:  Nuclear sclerotic cataract left eye. H25.12   POSTOPERATIVE DIAGNOSIS:    Nuclear sclerotic cataract left eye.     PROCEDURE:  Phacoemusification with posterior chamber intraocular lens placement of the left eye  Ultrasound time: Procedure(s): PHACOEMULSIFICATION, CATARACT, WITH IOL INSERTION 11.19 01:06.4 (Left)  LENS:   Implant Name Type Inv. Item Serial No. Manufacturer Lot No. LRB No. Used Action  LENS IOL TECNIS EYHANCE 21.5 - [id number] Intraocular Lens LENS IOL TECNIS EYHANCE 21.5 [id number] [employer]  Left 1 Implanted      SURGEON:  Dene FABIENE Etienne, MD   ANESTHESIA:  Topical with tetracaine drops and 2% Xylocaine  jelly, augmented with 1% preservative-free intracameral lidocaine .    COMPLICATIONS:  None.   DESCRIPTION OF PROCEDURE:  The patient was identified in the holding room and transported to the operating room and placed in the supine position under the operating microscope.  The left eye was identified as the operative eye and it was prepped and draped in the usual sterile ophthalmic fashion.   A 1 millimeter clear-corneal paracentesis was made at the 1:30 position.  0.5 ml of preservative-free 1% lidocaine  was injected into the anterior chamber.  The anterior chamber was filled with Viscoat viscoelastic.  A 2.4 millimeter keratome was used to make a near-clear corneal incision at the 10:30 position.  .  A curvilinear capsulorrhexis was made with a cystotome and capsulorrhexis forceps.  Balanced salt solution was used to hydrodissect and hydrodelineate the nucleus.   Phacoemulsification was then used in stop and chop fashion to remove the lens nucleus and epinucleus.  The remaining cortex was then removed using the irrigation and aspiration handpiece. Provisc was then placed into the capsular bag to distend it for lens placement.  A lens was then injected into the capsular bag.  The  remaining viscoelastic was aspirated.   Wounds were hydrated with balanced salt solution.  The anterior chamber was inflated to a physiologic pressure with balanced salt solution.  No wound leaks were noted. Vigamox 0.2 ml of a 1mg  per ml solution was injected into the anterior chamber for a dose of 0.2 mg of intracameral antibiotic at the completion of the case.   Timolol and Brimonidine drops were applied to the eye.  The patient was taken to the recovery room in stable condition without complications of anesthesia or surgery.  Sparrow Sanzo 09/27/2024, 12:05 PM

## 2024-09-27 NOTE — H&P (Signed)
 [facility]   Primary Care Physician:  Lenon Layman ORN, MD Ophthalmologist: Dr. Dene Etienne  Pre-Procedure History & Physical: HPI:  Latoya Herman is a 73 y.o. female here for ophthalmic surgery.   Past Medical History:  Diagnosis Date   Acquired hypothyroidism    Blind right eye    Chronic kidney disease    Claudication of both lower extremities    Coronary artery disease 10/2013   coronary artery disease s/p PCI to the LAD in 11/14   CVA (cerebral vascular accident) (HCC) 09/21/2020   Diabetes mellitus without complication (HCC)    Grade I diastolic dysfunction    H/O heart artery stent    Hyperlipidemia    Hypertension    Hyperthyroidism    Neuromuscular disorder (HCC)    poor circulation in feet--cold feet all the time   PVD (peripheral vascular disease) with claudication    Right-sided carotid artery disease, unspecified type    Schizophrenia (HCC)    Sleep apnea    significant amout of weight loss---no cpap   SOBOE (shortness of breath on exertion)    Stage 3a chronic kidney disease (CKD) (HCC)    Status post insertion of drug-eluting stent into left anterior descending (LAD) artery for coronary artery disease 10/2013   Valvular heart disease 01/11/2015    Past Surgical History:  Procedure Laterality Date   CAROTID PTA/STENT INTERVENTION Right 10/23/2020   Procedure: CAROTID PTA/STENT INTERVENTION;  Surgeon: Jama Cordella MATSU, MD;  Location: [facility] INVASIVE CV LAB;  Service: Cardiovascular;  Laterality: Right;   COLONOSCOPY  04/23/2010   COLONOSCOPY WITH ESOPHAGOGASTRODUODENOSCOPY (EGD)  04/23/2010   COLONOSCOPY WITH PROPOFOL  N/A 08/23/2020   Procedure: COLONOSCOPY WITH PROPOFOL ;  Surgeon: Maryruth Ole DASEN, MD;  Location: [facility] ENDOSCOPY;  Service: Endoscopy;  Laterality: N/A;   CORONARY ANGIOPLASTY WITH STENT PLACEMENT  10/19/2013   EYE SURGERY Right    pt lost retina in right eye from another surgery   SUPERFICIAL LYMPH NODE BIOPSY /  EXCISION  2013    Prior to Admission medications   Medication Sig Start Date End Date Taking? Authorizing Provider  atorvastatin  (LIPITOR ) 80 MG tablet Take 80 mg by mouth daily.   Yes [provider]  azelastine (ASTELIN) 0.1 % nasal spray Place 2 sprays into both nostrils daily. Use in each nostril as directed   Yes [provider]  carvedilol  (COREG ) 25 MG tablet Take 25 mg by mouth 2 (two) times daily with a meal.   Yes [provider]  clobetasol cream (TEMOVATE) 0.05 % Apply 1 Application topically 2 (two) times daily.   Yes [provider]  clotrimazole-betamethasone (LOTRISONE) cream Apply 1 Application topically 2 (two) times daily.   Yes [provider]  cyanocobalamin (VITAMIN B12) 1000 MCG tablet Take 1,000 mcg by mouth daily.   Yes [provider]  fluticasone  (FLONASE  ALLERGY RELIEF) 50 MCG/ACT nasal spray Place 2 sprays into both nostrils daily.   Yes [provider]  levothyroxine  (SYNTHROID ) 75 MCG tablet Take 100 mcg by mouth daily before breakfast.   Yes [provider]  Multiple Vitamins-Minerals (MULTIVITAMIN WITH MINERALS) tablet Take 1 tablet by mouth daily.   Yes [provider]  Omega-3 Fatty Acids (OMEGA 3 FISH OIL PO) Take 1 tablet by mouth daily.   Yes [provider]  Study - EMPACT-MI - empagliflozin (JARDIANCE) 10 mg or placebo tablet (PI-Stuckey) Take 1 tablet by mouth daily.   Yes [provider]    Allergies as  of 09/13/2024 - Review Complete 11/25/2020  Allergen Reaction Noted   Penicillins Swelling and Other (See Comments) 01/11/2015   Lisinopril Rash 09/08/2017    Family History  Problem Relation Age of Onset   Breast cancer Neg Hx     Social History   Socioeconomic History   Marital status: Married    Spouse name: Not on file   Number of children: Not on file   Years of education: Not on file   Highest education level: Not on file  Occupational  History   Not on file  Tobacco Use   Smoking status: Never   Smokeless tobacco: Never  Vaping Use   Vaping status: Never Used  Substance and Sexual Activity   Alcohol use: Never   Drug use: Never   Sexual activity: Not on file  Other Topics Concern   Not on file  Social History Narrative   Not on file   Social Drivers of Health   Financial Resource Strain: Low Risk  (09/18/2024)   Received from [facility]   Overall Financial Resource Strain (CARDIA)    Difficulty of Paying Living Expenses: Not hard at all  Food Insecurity: No Food Insecurity (09/18/2024)   Received from [facility]   Hunger Vital Sign    Within the past 12 months, you worried that your food would run out before you got the money to buy more.: Never true    Within the past 12 months, the food you bought just didn't last and you didn't have money to get more.: Never true  Transportation Needs: No Transportation Needs (09/18/2024)   Received from [facility] - Transportation    In the past 12 months, has lack of transportation kept you from medical appointments or from getting medications?: No    Lack of Transportation (Non-Medical): No  Physical Activity: Not on file  Stress: Not on file  Social Connections: Not on file  Intimate Partner Violence: Not on file    Review of Systems: See HPI, otherwise negative ROS  Physical Exam: BP (!) 165/61   Pulse (!) 55   Temp (!) 96.8 F (36 C) (Temporal)   Resp 12   Ht 5' 5 (1.651 m)   Wt 57.7 kg   SpO2 97%   BMI 21.17 kg/m  General:   Alert,  pleasant and cooperative in NAD Head:  Normocephalic and atraumatic. Lungs:  Clear to auscultation.    Heart:  Regular rate and rhythm.   Impression/Plan: Latoya Herman is here for ophthalmic surgery.  Risks, benefits, limitations, and alternatives regarding ophthalmic surgery have been reviewed with the patient.  Questions have been answered.  All  parties agreeable.   MITTIE GASKIN, MD  09/27/2024, 10:52 AM

## 2024-10-09 ENCOUNTER — Other Ambulatory Visit: Payer: Self-pay | Admitting: Internal Medicine

## 2024-10-09 DIAGNOSIS — Z1231 Encounter for screening mammogram for malignant neoplasm of breast: Secondary | ICD-10-CM

## 2024-11-08 ENCOUNTER — Ambulatory Visit
Admission: RE | Admit: 2024-11-08 | Discharge: 2024-11-08 | Disposition: A | Discharge status: Home / Self Care | Source: Ambulatory Visit | Attending: Internal Medicine | Admitting: Internal Medicine

## 2024-11-08 DIAGNOSIS — Z1231 Encounter for screening mammogram for malignant neoplasm of breast: Secondary | ICD-10-CM | POA: Diagnosis present

## 2024-11-29 ENCOUNTER — Encounter: Payer: Self-pay | Admitting: Internal Medicine

## 2024-11-29 ENCOUNTER — Encounter: Admission: RE | Disposition: A | Payer: Self-pay | Source: Home / Self Care | Attending: Internal Medicine

## 2024-11-29 ENCOUNTER — Ambulatory Visit
Admission: RE | Admit: 2024-11-29 | Discharge: 2024-11-29 | Disposition: A | Attending: Internal Medicine | Admitting: Internal Medicine

## 2024-11-29 ENCOUNTER — Ambulatory Visit: Admitting: Anesthesiology

## 2024-11-29 DIAGNOSIS — Z1211 Encounter for screening for malignant neoplasm of colon: Secondary | ICD-10-CM | POA: Diagnosis present

## 2024-11-29 DIAGNOSIS — R634 Abnormal weight loss: Secondary | ICD-10-CM | POA: Insufficient documentation

## 2024-11-29 DIAGNOSIS — K635 Polyp of colon: Secondary | ICD-10-CM | POA: Diagnosis not present

## 2024-11-29 DIAGNOSIS — Z91128 Patient's intentional underdosing of medication regimen for other reason: Secondary | ICD-10-CM | POA: Insufficient documentation

## 2024-11-29 DIAGNOSIS — K5730 Diverticulosis of large intestine without perforation or abscess without bleeding: Secondary | ICD-10-CM | POA: Insufficient documentation

## 2024-11-29 DIAGNOSIS — Z8673 Personal history of transient ischemic attack (TIA), and cerebral infarction without residual deficits: Secondary | ICD-10-CM | POA: Diagnosis not present

## 2024-11-29 DIAGNOSIS — I129 Hypertensive chronic kidney disease with stage 1 through stage 4 chronic kidney disease, or unspecified chronic kidney disease: Secondary | ICD-10-CM | POA: Diagnosis not present

## 2024-11-29 DIAGNOSIS — T45526A Underdosing of antithrombotic drugs, initial encounter: Secondary | ICD-10-CM | POA: Diagnosis not present

## 2024-11-29 DIAGNOSIS — Z6821 Body mass index (BMI) 21.0-21.9, adult: Secondary | ICD-10-CM | POA: Insufficient documentation

## 2024-11-29 DIAGNOSIS — K640 First degree hemorrhoids: Secondary | ICD-10-CM | POA: Insufficient documentation

## 2024-11-29 DIAGNOSIS — K644 Residual hemorrhoidal skin tags: Secondary | ICD-10-CM | POA: Diagnosis not present

## 2024-11-29 DIAGNOSIS — K5904 Chronic idiopathic constipation: Secondary | ICD-10-CM | POA: Insufficient documentation

## 2024-11-29 DIAGNOSIS — I2510 Atherosclerotic heart disease of native coronary artery without angina pectoris: Secondary | ICD-10-CM | POA: Insufficient documentation

## 2024-11-29 DIAGNOSIS — N1831 Chronic kidney disease, stage 3a: Secondary | ICD-10-CM | POA: Diagnosis not present

## 2024-11-29 DIAGNOSIS — Z95828 Presence of other vascular implants and grafts: Secondary | ICD-10-CM | POA: Insufficient documentation

## 2024-11-29 DIAGNOSIS — D123 Benign neoplasm of transverse colon: Secondary | ICD-10-CM | POA: Insufficient documentation

## 2024-11-29 DIAGNOSIS — D122 Benign neoplasm of ascending colon: Secondary | ICD-10-CM | POA: Insufficient documentation

## 2024-11-29 DIAGNOSIS — K642 Third degree hemorrhoids: Secondary | ICD-10-CM | POA: Diagnosis not present

## 2024-11-29 DIAGNOSIS — E1122 Type 2 diabetes mellitus with diabetic chronic kidney disease: Secondary | ICD-10-CM | POA: Diagnosis not present

## 2024-11-29 DIAGNOSIS — Z955 Presence of coronary angioplasty implant and graft: Secondary | ICD-10-CM | POA: Insufficient documentation

## 2024-11-29 HISTORY — PX: COLONOSCOPY: SHX5424

## 2024-11-29 HISTORY — PX: POLYPECTOMY: SHX149

## 2024-11-29 LAB — GLUCOSE, CAPILLARY: Glucose-Capillary: 112 mg/dL — ABNORMAL HIGH (ref 70–99)

## 2024-11-29 SURGERY — COLONOSCOPY
Anesthesia: General

## 2024-11-29 MED ORDER — PROPOFOL 500 MG/50ML IV EMUL
INTRAVENOUS | Status: DC | PRN
  Administered 2024-11-29: 09:00:00 120 ug/kg/min via INTRAVENOUS

## 2024-11-29 MED ORDER — SODIUM CHLORIDE 0.9 % IV SOLN: INTRAVENOUS | Status: DC

## 2024-11-29 MED ORDER — ONDANSETRON HCL 4 MG/2ML IJ SOLN
4.0000 mg | Freq: Once | INTRAMUSCULAR | Status: AC
  Administered 2024-11-29: 09:00:00 4 mg via INTRAVENOUS

## 2024-11-29 MED ORDER — STERILE WATER FOR IRRIGATION IR SOLN
Status: DC | PRN
  Administered 2024-11-29 (×2): 60 mL

## 2024-11-29 MED ORDER — ONDANSETRON HCL 4 MG/2ML IJ SOLN
INTRAMUSCULAR | Status: AC
  Filled 2024-11-29: qty 2

## 2024-11-29 MED ADMIN — PROPOFOL 200 MG/20ML IV EMUL: 20 mg | INTRAVENOUS | @ 09:00:00 | NDC 00069020901

## 2024-11-29 MED ADMIN — PROPOFOL 200 MG/20ML IV EMUL: 80 mg | INTRAVENOUS | @ 09:00:00 | NDC 00069020901

## 2024-11-29 NOTE — Op Note (Signed)
 [facility] Gastroenterology Patient Name: Latoya Herman Procedure Date: 11/29/2024 8:46 AM MRN: [id number] Account #: [id number] Date of Birth: 1951-09-10 Admit Type: Outpatient Age: 73 Room: [facility] ENDO ROOM 2 Gender: Female Note Status: Finalized Instrument Name: Colon Scope [id number] Procedure:             Colonoscopy Indications:           High risk colon cancer surveillance: Personal history                         of multiple (3 or more) adenomas, Weight loss, Chronic                         idiopathic constipation. Providers:             Daviana Haymaker K. Aundria MD, MD Referring MD:          Layman ORN. Lenon MD, MD (Referring MD) Medicines:             Propofol  per Anesthesia Complications:         No immediate complications. Estimated blood loss:                         Minimal. Procedure:             Pre-Anesthesia Assessment:                        - The risks and benefits of the procedure and the                         sedation options and risks were discussed with the                         patient. All questions were answered and informed                         consent was obtained.                        - Patient identification and proposed procedure were                         verified prior to the procedure by the nurse. The                         procedure was verified in the procedure room.                        - ASA Grade Assessment: III - A patient with severe                         systemic disease.                        - After reviewing the risks and benefits, the patient                         was deemed in satisfactory condition to undergo the  procedure.                        After obtaining informed consent, the colonoscope was                         passed under direct vision. Throughout the procedure,                         the patient's blood pressure, pulse, and oxygen                          saturations were monitored continuously. The                         Colonoscope was introduced through the anus and                         advanced to the the cecum, identified by appendiceal                         orifice and ileocecal valve. The colonoscopy was                         performed without difficulty. The patient tolerated                         the procedure well. The quality of the bowel                         preparation was good. The ileocecal valve, appendiceal                         orifice, and rectum were photographed. Findings:      Non-bleeding internal hemorrhoids were found during retroflexion. The       hemorrhoids were Grade I (internal hemorrhoids that do not prolapse).      The perianal exam findings include internal hemorrhoids that prolapse       with straining, but require manual replacement into the anal canal       (Grade III).      Non-bleeding internal hemorrhoids were found during retroflexion. The       hemorrhoids were Grade I (internal hemorrhoids that do not prolapse).      A few medium-mouthed diverticula were found in the sigmoid colon.      A 10 mm polyp was found in the transverse colon. The polyp was sessile.       The polyp was removed with a cold snare. Resection and retrieval were       complete. Estimated blood loss was minimal.      A 7 mm polyp was found in the ascending colon. The polyp was sessile.       The polyp was removed with a cold snare. Resection and retrieval were       complete. Estimated blood loss was minimal.      A 5 mm polyp was found in the cecum. The polyp was sessile. The polyp       was removed with a cold snare. Resection was complete, but the polyp       tissue was not retrieved. Estimated blood loss  was minimal.      A 3 mm polyp was found in the cecum. The polyp was sessile. The polyp       was removed with a cold biopsy forceps. Resection and retrieval were       complete. Estimated blood loss was  minimal.      The exam was otherwise without abnormality. Impression:            - Non-bleeding internal hemorrhoids.                        - Internal hemorrhoids that prolapse with straining,                         but require manual replacement into the anal canal                         (Grade III) found on perianal exam.                        - Non-bleeding internal hemorrhoids.                        - Diverticulosis in the sigmoid colon.                        - One 10 mm polyp in the transverse colon, removed                         with a cold snare. Resected and retrieved.                        - One 7 mm polyp in the ascending colon, removed with                         a cold snare. Resected and retrieved.                        - One 5 mm polyp in the cecum, removed with a cold                         snare. Complete resection. Polyp tissue not retrieved.                        - One 3 mm polyp in the cecum, removed with a cold                         biopsy forceps. Resected and retrieved.                        - The examination was otherwise normal. Recommendation:        - Patient has a contact number available for                         emergencies. The signs and symptoms of potential                         delayed complications were discussed with the patient.  Return to normal activities tomorrow. Written                         discharge instructions were provided to the patient.                        - Resume previous diet.                        - Continue present medications.                        - If polyps are benign or adenomatous without                         dysplasia, I will advise NO further colonoscopy due to                         advanced age and/or severe comorbidity.                        - Return to GI office PRN.                        - The findings and recommendations were discussed with                          the patient. Procedure Code(s):     --- Professional ---                        [phone], Colonoscopy, flexible; with removal of                         tumor(s), polyp(s), or other lesion(s) by snare                         technique                        45380, 59, Colonoscopy, flexible; with biopsy, single                         or multiple Diagnosis Code(s):     --- Professional ---                        K57.30, Diverticulosis of large intestine without                         perforation or abscess without bleeding                        D12.0, Benign neoplasm of cecum                        D12.3, Benign neoplasm of transverse colon (hepatic                         flexure or splenic flexure)                        D12.2, Benign neoplasm  of ascending colon                        K64.2, Third degree hemorrhoids                        Z86.010, Personal history of colonic polyps CPT copyright 2022 American Medical Association. All rights reserved. The codes documented in this report are preliminary and upon coder review may  be revised to meet current compliance requirements. Ladell MARLA Boss MD, MD 11/29/2024 9:50:16 AM This report has been signed electronically. Number of Addenda: 0 Note Initiated On: 11/29/2024 8:46 AM Scope Withdrawal Time: 0 hours 9 minutes 36 seconds  Total Procedure Duration: 0 hours 18 minutes 59 seconds  Estimated Blood Loss:  Estimated blood loss was minimal. Estimated blood loss                         was minimal.      [facility]

## 2024-11-29 NOTE — Anesthesia Postprocedure Evaluation (Signed)
 Anesthesia Post Note  Patient: Latoya Herman  Procedure(s) Performed: COLONOSCOPY POLYPECTOMY, INTESTINE  Patient location during evaluation: Endoscopy Anesthesia Type: General Level of consciousness: awake and alert Pain management: pain level controlled Vital Signs Assessment: post-procedure vital signs reviewed and stable Respiratory status: spontaneous breathing, nonlabored ventilation and respiratory function stable Cardiovascular status: blood pressure returned to baseline and stable Postop Assessment: no apparent nausea or vomiting Anesthetic complications: no   No notable events documented.   Last Vitals:  Vitals:   11/29/24 0947 11/29/24 0957  BP: (!) 93/40 104/61  Pulse: (!) 59 62  Resp: 18 20  Temp:    SpO2: 98% 98%    Last Pain:  Vitals:   11/29/24 0957  TempSrc:   PainSc: 0-No pain                 Fairy POUR Stephens [place]

## 2024-11-29 NOTE — Interval H&P Note (Signed)
 History and Physical Interval Note:  11/29/2024 9:19 AM  Latoya Herman  has presented today for surgery, with the diagnosis of Chronic idiopathic constipation (K59.04) Hx of adenomatous colonic polyps (Z86.0101) Weight loss (R63.4).  The various methods of treatment have been discussed with the patient and family. After consideration of risks, benefits and other options for treatment, the patient has consented to  Procedures with comments: COLONOSCOPY (N/A) - DM/ Plavix  as a surgical intervention.  The patient's history has been reviewed, patient examined, no change in status, stable for surgery.  I have reviewed the patient's chart and labs.  Questions were answered to the patient's satisfaction.     [place], Saba Gomm

## 2024-11-29 NOTE — H&P (Signed)
 Outpatient short stay form Pre-procedure 11/29/2024 9:16 AM Glenice Ciccone K. Aundria, M.D.  Primary Physician: Layman Piety, M.D.  Reason for visit:  Chronic idiopathic constipation, history of colon polyps, unexplained weight loss.  History of present illness:  Ms. Benning presents to the [facility] GI clinic after receiving colon letter in the mail telling her it was time to have repeat colonoscopy. Last colonoscopy performed by Dr. Maryruth Sept 2021 removed four subcentimeter polyps from ascending, transverse, and rectosigmoid colon with path showing TA x2, SSA x1, and hyperplastic polyp x1. A 3-year repeat was advised by Dr. Maryruth. She also reports she has been having issues with constipation for the past 1-year. She can go 1-3 days in between bowel movements. Prior to 1-year ago she wasn't having any issues with constipation. She has been using OTC fiber gummies, probiotics, Miralax powder, and suppositories. As long as she uses a combination of these things her bowels are moving. If she has gone 2-3 days in between a BM, she will use the suppository. She denies any issues with hematochezia or melena. She denies any episodes of fecal incontinence. She has been under a considerable amount of grief recently. She lost her husband unexpectantly in June this year due to cardiac issues. She has lost 11-lbs since the death of her husband. She has been working with CPP here at the clinic to get her diabetes under better control as her A1c% was 10.5% several months ago. She has cut out a lot of sugars and trying to eat a much healthier diet. She has not been taking her Plavix  since her husband passed away. She is supposed to be taking it, but has chosen not to take it. Her husband had a lot of issues with Plavix  so she stopped it. She follows in Cardiology with Dr. Ammon. She has hx of coronary stent placed in 2014. 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 She denies any complaints of  dysphagia, odynophagia, heartburn, reflux, nausea, or vomiting. No other questions or concerns at this time.    Current Medications[1]  Medications Prior to Admission  Medication Sig Dispense Refill Last Dose/Taking   amLODipine (NORVASC) 5 MG tablet Take 5 mg by mouth daily.   11/29/2024 Morning   carvedilol  (COREG ) 25 MG tablet Take 25 mg by mouth 2 (two) times daily with a meal.   11/28/2024   levothyroxine  (SYNTHROID ) 75 MCG tablet Take 100 mcg by mouth daily before breakfast.   11/29/2024 Morning   metFORMIN (GLUCOPHAGE) 500 MG tablet Take by mouth 2 (two) times daily with a meal.   Past Week   Study - EMPACT-MI - empagliflozin (JARDIANCE) 10 mg or placebo tablet (PI-Stuckey) Take 1 tablet by mouth daily.   Past Week   atorvastatin  (LIPITOR ) 80 MG tablet Take 80 mg by mouth daily.      azelastine (ASTELIN) 0.1 % nasal spray Place 2 sprays into both nostrils daily. Use in each nostril as directed      clobetasol cream (TEMOVATE) 0.05 % Apply 1 Application topically 2 (two) times daily.      clotrimazole-betamethasone (LOTRISONE) cream Apply 1 Application topically 2 (two) times daily.      cyanocobalamin (VITAMIN B12) 1000 MCG tablet Take 1,000 mcg by mouth daily.      fluticasone  (FLONASE  ALLERGY RELIEF) 50 MCG/ACT nasal spray Place 2 sprays into both nostrils daily.      Multiple Vitamins-Minerals (MULTIVITAMIN WITH MINERALS) tablet Take 1 tablet by mouth daily.  Omega-3 Fatty Acids (OMEGA 3 FISH OIL PO) Take 1 tablet by mouth daily.        Allergies[2]   Past Medical History:  Diagnosis Date   Acquired hypothyroidism    Blind right eye    Chronic kidney disease    Claudication of both lower extremities    Coronary artery disease 10/2013   coronary artery disease s/p PCI to the LAD in 11/14   CVA (cerebral vascular accident) (HCC) 09/21/2020   Diabetes mellitus without complication (HCC)    Grade I diastolic dysfunction    H/O heart artery stent    Hyperlipidemia     Hypertension    Hyperthyroidism    Neuromuscular disorder (HCC)    poor circulation in feet--cold feet all the time   PVD (peripheral vascular disease) with claudication    Right-sided carotid artery disease, unspecified type    Schizophrenia (HCC)    Sleep apnea    significant amout of weight loss---no cpap   SOBOE (shortness of breath on exertion)    Stage 3a chronic kidney disease (CKD) (HCC)    Status post insertion of drug-eluting stent into left anterior descending (LAD) artery for coronary artery disease 10/2013   Valvular heart disease 01/11/2015    Review of systems:  Otherwise negative.    Physical Exam  Gen: Alert, oriented. Appears stated age.  HEENT: [place]/AT. PERRLA. Lungs: CTA, no wheezes. CV: RR nl S1, S2. Abd: soft, benign, no masses. BS+ Ext: No edema. Pulses 2+    Planned procedures: Proceed with colonoscopy. The patient understands the nature of the planned procedure, indications, risks, alternatives and potential complications including but not limited to bleeding, infection, perforation, damage to internal organs and possible oversedation/side effects from anesthesia. The patient agrees and gives consent to proceed.  Please refer to procedure notes for findings, recommendations and patient disposition/instructions.     Keola Heninger K. Aundria, M.D. Gastroenterology 11/29/2024  9:16 AM          [1]  Current Facility-Administered Medications:    0.9 %  sodium chloride  infusion, , Intravenous, Continuous, Farah Benish K, MD, Last Rate: 20 mL/hr at 11/29/24 0848, New Bag at 11/29/24 0848 [2]  Allergies Allergen Reactions   Penicillins Swelling and Other (See Comments)   Lisinopril Rash    Possible rash

## 2024-11-29 NOTE — Anesthesia Preprocedure Evaluation (Signed)
 Anesthesia Evaluation  Patient identified by MRN, date of birth, ID band Patient awake    Reviewed: Allergy & Precautions, NPO status , Patient's Chart, lab work & pertinent test results  History of Anesthesia Complications Negative for: history of anesthetic complications  Airway Mallampati: III  TM Distance: <3 FB Neck ROM: full    Dental  (+) Chipped   Pulmonary neg shortness of breath, sleep apnea    Pulmonary exam normal        Cardiovascular Exercise Tolerance: Good hypertension, (-) angina + CAD  Normal cardiovascular exam     Neuro/Psych  Neuromuscular disease CVA  negative psych ROS   GI/Hepatic Neg liver ROS,GERD  Controlled,,  Endo/Other  diabetes, Type 2    Renal/GU Renal disease  negative genitourinary   Musculoskeletal   Abdominal   Peds  Hematology negative hematology ROS (+)   Anesthesia Other Findings Past Medical History: No date: Acquired hypothyroidism No date: Blind right eye No date: Chronic kidney disease No date: Claudication of both lower extremities 10/2013: Coronary artery disease     Comment:  coronary artery disease s/p PCI to the LAD in 11/14 09/21/2020: CVA (cerebral vascular accident) (HCC) No date: Diabetes mellitus without complication (HCC) No date: Grade I diastolic dysfunction No date: H/O heart artery stent No date: Hyperlipidemia No date: Hypertension No date: Hyperthyroidism No date: Neuromuscular disorder (HCC)     Comment:  poor circulation in feet--cold feet all the time No date: PVD (peripheral vascular disease) with claudication No date: Right-sided carotid artery disease, unspecified type No date: Schizophrenia (HCC) No date: Sleep apnea     Comment:  significant amout of weight loss---no cpap No date: SOBOE (shortness of breath on exertion) No date: Stage 3a chronic kidney disease (CKD) (HCC) 10/2013: Status post insertion of drug-eluting stent into left   anterior descending (LAD) artery for coronary artery disease 01/11/2015: Valvular heart disease  Past Surgical History: 10/23/2020: CAROTID PTA/STENT INTERVENTION; Right     Comment:  Procedure: CAROTID PTA/STENT INTERVENTION;  Surgeon:               Jama Cordella MATSU, MD;  Location: [facility] INVASIVE CV LAB;               Service: Cardiovascular;  Laterality: Right; 09/27/2024: CATARACT EXTRACTION W/PHACO; Left     Comment:  Procedure: PHACOEMULSIFICATION, CATARACT, WITH IOL               INSERTION 11.19 01:06.4;  Surgeon: Mittie Gaskin,               MD;  Location: [facility];  Service:               Ophthalmology;  Laterality: Left; 04/23/2010: COLONOSCOPY 04/23/2010: COLONOSCOPY WITH ESOPHAGOGASTRODUODENOSCOPY (EGD) 08/23/2020: COLONOSCOPY WITH PROPOFOL ; N/A     Comment:  Procedure: COLONOSCOPY WITH PROPOFOL ;  Surgeon:               Maryruth Ole DASEN, MD;  Location: [facility] ENDOSCOPY;                Service: Endoscopy;  Laterality: N/A; 10/19/2013: CORONARY ANGIOPLASTY WITH STENT PLACEMENT No date: EYE SURGERY; Right     Comment:  pt lost retina in right eye from another surgery 2013: SUPERFICIAL LYMPH NODE BIOPSY / EXCISION     Reproductive/Obstetrics negative OB ROS  Anesthesia Physical Anesthesia Plan  ASA: 3  Anesthesia Plan: General   Post-op Pain Management:    Induction: Intravenous  PONV Risk Score and Plan: Propofol  infusion and TIVA  Airway Management Planned: Natural Airway and Nasal Cannula  Additional Equipment:   Intra-op Plan:   Post-operative Plan:   Informed Consent: I have reviewed the patients History and Physical, chart, labs and discussed the procedure including the risks, benefits and alternatives for the proposed anesthesia with the patient or authorized representative who has indicated his/her understanding and acceptance.     Dental Advisory Given  Plan Discussed  with: Anesthesiologist, CRNA and Surgeon  Anesthesia Plan Comments: (Patient consented for risks of anesthesia including but not limited to:  - adverse reactions to medications - risk of airway placement if required - damage to eyes, teeth, lips or other oral mucosa - nerve damage due to positioning  - sore throat or hoarseness - Damage to heart, brain, nerves, lungs, other parts of body or loss of life  Patient voiced understanding and assent.)        Anesthesia Quick Evaluation

## 2024-11-29 NOTE — Transfer of Care (Signed)
 Immediate Anesthesia Transfer of Care Note  Patient: Latoya Herman  Procedure(s) Performed: COLONOSCOPY POLYPECTOMY, INTESTINE  Patient Location: PACU and Endoscopy Unit  Anesthesia Type:General  Level of Consciousness: drowsy  Airway & Oxygen Therapy: Patient Spontanous Breathing  Post-op Assessment: Report given to RN and Post -op Vital signs reviewed and stable  Post vital signs: Reviewed and stable  Last Vitals:  Vitals Value Taken Time  BP 93/40 11/29/24 09:48  Temp    Pulse 69 11/29/24 09:51  Resp 21 11/29/24 09:51  SpO2 99 % 11/29/24 09:51  Vitals shown include unfiled device data.  Last Pain:  Vitals:   11/29/24 0947  TempSrc:   PainSc: Asleep         Complications: No notable events documented.

## 2024-12-01 LAB — SURGICAL PATHOLOGY
